# Patient Record
Sex: Female | Born: 1990 | Hispanic: Yes | Marital: Married | Smoking: Never smoker
Health system: Southern US, Community
[De-identification: ages and names within clinical notes are randomized; demographics above are authoritative.]

## PROBLEM LIST (undated history)

## (undated) DIAGNOSIS — R51 Headache: Secondary | ICD-10-CM

## (undated) DIAGNOSIS — Z8759 Personal history of other complications of pregnancy, childbirth and the puerperium: Secondary | ICD-10-CM

## (undated) DIAGNOSIS — Z9889 Other specified postprocedural states: Secondary | ICD-10-CM

## (undated) DIAGNOSIS — R112 Nausea with vomiting, unspecified: Secondary | ICD-10-CM

## (undated) DIAGNOSIS — R519 Headache, unspecified: Secondary | ICD-10-CM

## (undated) HISTORY — DX: Other specified postprocedural states: R11.2

## (undated) HISTORY — DX: Personal history of other complications of pregnancy, childbirth and the puerperium: Z87.59

## (undated) HISTORY — PX: WISDOM TOOTH EXTRACTION: SHX21

## (undated) HISTORY — DX: Headache: R51

## (undated) HISTORY — DX: Other specified postprocedural states: Z98.890

## (undated) HISTORY — DX: Headache, unspecified: R51.9

---
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
## 2016-06-23 LAB — HM PAP SMEAR

## 2016-09-01 ENCOUNTER — Ambulatory Visit (INDEPENDENT_AMBULATORY_CARE_PROVIDER_SITE_OTHER): Payer: BLUE CROSS/BLUE SHIELD | Admitting: Family Medicine

## 2016-09-01 ENCOUNTER — Encounter: Payer: Self-pay | Admitting: Family Medicine

## 2016-09-01 VITALS — Temp 98.4°F | Ht 65.0 in | Wt 128.0 lb

## 2016-09-01 DIAGNOSIS — Z8639 Personal history of other endocrine, nutritional and metabolic disease: Secondary | ICD-10-CM | POA: Diagnosis not present

## 2016-09-01 DIAGNOSIS — R42 Dizziness and giddiness: Secondary | ICD-10-CM

## 2016-09-01 DIAGNOSIS — R519 Headache, unspecified: Secondary | ICD-10-CM

## 2016-09-01 DIAGNOSIS — R51 Headache: Secondary | ICD-10-CM

## 2016-09-01 LAB — CBC WITH DIFFERENTIAL/PLATELET
BASOS ABS: 64 {cells}/uL (ref 0–200)
Basophils Relative: 1 %
EOS ABS: 64 {cells}/uL (ref 15–500)
Eosinophils Relative: 1 %
HEMATOCRIT: 34.9 % — AB (ref 35.0–45.0)
HEMOGLOBIN: 11.2 g/dL — AB (ref 11.7–15.5)
LYMPHS ABS: 1984 {cells}/uL (ref 850–3900)
LYMPHS PCT: 31 %
MCH: 25.5 pg — AB (ref 27.0–33.0)
MCHC: 32.1 g/dL (ref 32.0–36.0)
MCV: 79.5 fL — AB (ref 80.0–100.0)
MONO ABS: 448 {cells}/uL (ref 200–950)
MPV: 9 fL (ref 7.5–12.5)
Monocytes Relative: 7 %
NEUTROS PCT: 60 %
Neutro Abs: 3840 cells/uL (ref 1500–7800)
Platelets: 328 10*3/uL (ref 140–400)
RBC: 4.39 MIL/uL (ref 3.80–5.10)
RDW: 14.5 % (ref 11.0–15.0)
WBC: 6.4 10*3/uL (ref 3.8–10.8)

## 2016-09-01 LAB — FERRITIN: Ferritin: 4 ng/mL — ABNORMAL LOW (ref 10–154)

## 2016-09-01 NOTE — Progress Notes (Signed)
Chief Complaint  Patient presents with  . Establish Care    pt states having h/a's and alittle dizziness x 1 week       New Patient Visit SUBJECTIVE: HPI: Hailey Schneider is an 26 y.o.female who is being seen for establishing care.  Dizziness Light headed feeling over past week. Lasting several seconds, seeming to relieve when she is sitting and not moving. Associated b/l headache. Happens are 3x/day. She has not passed out. No injury or inciting/relieving factors that she has noticed. She will sometimes take ibuprofen, but does not like to take medication. She will intermittently get some nausea. No vomiting, fevers, injury, light/sound sensitivity, trouble swallowing, trouble with speech, weakness, numbness or tingling. She does have a lower blood pressure in addition to a hx of low iron. She is not currently taking a supplement and does not know if it has been checked recently.    No Known Allergies  Past Medical History:  Diagnosis Date  . Frequent headaches   . History of UTI    Past Surgical History:  Procedure Laterality Date  . WISDOM TOOTH EXTRACTION     Social History   Social History  . Marital status: Single   Social History Main Topics  . Smoking status: Never Smoker  . Smokeless tobacco: Never Used  . Alcohol use Yes     Comment: Social  . Drug use: No   Family History  Problem Relation Age of Onset  . Cancer Mother     Breast  . Hypertension Maternal Grandmother   . Stroke Maternal Grandfather     Takes no medications routinely.  Patient's last menstrual period was 08/27/2016 (exact date).  ROS Cardiovascular: Denies chest pain  Respiratory: Denies dyspnea   OBJECTIVE: Temp 98.4 F (36.9 C) (Oral)   Ht 5\' 5"  (1.651 m)   Wt 128 lb (58.1 kg)   LMP 08/27/2016 (Exact Date)   SpO2 95%   BMI 21.30 kg/m   Constitutional: -  VS reviewed -  Well developed, well nourished, appears stated age -  No apparent distress  Psychiatric: -  Oriented to  person, place, and time -  Memory intact -  Affect and mood normal -  Fluent conversation, good eye contact -  Judgment and insight age appropriate  Eye: -  Conjunctivae clear, no discharge -  Pupils symmetric, round, reactive to light  ENMT: -  Ears are patent b/l without erythema or discharge. TM's are shiny and clear b/l without evidence of effusion or infection. -  Oral mucosa without lesions, tongue and uvula midline    Tonsils not enlarged, no erythema, no exudate, trachea midline    Pharynx moist, no lesions, no erythema  Neck: -  No gross swelling, no palpable masses -  Thyroid midline, not enlarged, mobile, no palpable masses  Cardiovascular: -  RRR, no murmurs -  No LE edema  Respiratory: -  Normal respiratory effort, no accessory muscle use, no retraction -  Breath sounds equal, no wheezes, no ronchi, no crackles  Gastrointestinal: -  Bowel sounds normal -  No tenderness, no distention, no guarding, no masses  Neurological:  -  CN II - XII intact -  3/4 patellar reflex, 2/4 calcaneal and biceps reflex, no clonus -  No cerebellar signs -  Sensation grossly intact to light touch, equal bilaterally  Musculoskeletal: -  No clubbing, no cyanosis -  Gait normal -  5/5 strength throughout  Skin: -  No significant lesion on inspection -  Warm and dry to palpation   ASSESSMENT/PLAN: Nonintractable headache, unspecified chronicity pattern, unspecified headache type  Light headed - Plan: CBC w/Diff, Pregnancy, urine  History of iron deficiency - Plan: Ferritin, IBC panel  Orders as above. Orthostatics unremarkable. Vestibular migraine a possibility- Aleve 550 mg and repeat in 1 hr if not better. Stay well hydrated. Patient should return in 6 weeks, cancel appt if she starts doing better. Will try a prophylactic if no improvement- SNRI vs B-blocker. The patient voiced understanding and agreement to the plan.   [place], DO 09/01/16  5:30 PM

## 2016-09-01 NOTE — Patient Instructions (Addendum)
When you start to feel the light-headedness/headache come on, take 2 Aleve (550 mg total). If you are still having issues after 1 hour, repeat this (2 tabs).  Try to keep a headache diary- when do you get headaches, what are associated symptoms other than lightheadedness, what are you doing, any dietary association, etc.  Stay well hydrated.  Give [place] around 2-3 business days to get you your lab results. Call us if you have not heard anything by then.

## 2016-09-02 LAB — PREGNANCY, URINE: PREG TEST UR: NEGATIVE

## 2016-09-05 LAB — IRON: Iron: 23 ug/dL — ABNORMAL LOW (ref 40–190)

## 2016-09-05 LAB — IBC PANEL
%SAT: 7 % — ABNORMAL LOW (ref 11–50)
TIBC: 351 ug/dL (ref 250–450)
UIBC: 328 ug/dL (ref 125–400)

## 2016-10-27 ENCOUNTER — Ambulatory Visit: Payer: BLUE CROSS/BLUE SHIELD | Admitting: Family Medicine

## 2017-05-16 ENCOUNTER — Encounter: Payer: Self-pay | Admitting: Family Medicine

## 2017-05-16 ENCOUNTER — Ambulatory Visit (INDEPENDENT_AMBULATORY_CARE_PROVIDER_SITE_OTHER): Payer: BLUE CROSS/BLUE SHIELD | Admitting: Family Medicine

## 2017-05-16 VITALS — BP 102/68 | HR 61 | Temp 98.4°F | Ht 64.0 in | Wt 130.0 lb

## 2017-05-16 DIAGNOSIS — L563 Solar urticaria: Secondary | ICD-10-CM

## 2017-05-16 MED ORDER — PREDNISONE 20 MG PO TABS: 40.0000 mg | ORAL_TABLET | Freq: Every day | ORAL | 0 refills | Status: DC

## 2017-05-16 NOTE — Progress Notes (Signed)
Pre visit review using our clinic review tool, if applicable. No additional management support is needed unless otherwise documented below in the visit note. 

## 2017-05-16 NOTE — Progress Notes (Signed)
Chief Complaint  Patient presents with  . Rash    Hailey Schneider is a 26 y.o. female here for a skin complaint.  Duration: 6 days Location: arms and legs Pruritic? Yes Painful? No Drainage? No New soaps/lotions/topicals/detergents? No Sick contacts? No Other associated symptoms: none Therapies tried thus far: OTC itch cream, short amounts of relief Hx of solar urticaria, recently got back from [place].   ROS:  Const: No fevers Skin: As noted in HPI  Past Medical History:  Diagnosis Date  . Frequent headaches   . History of UTI    No Known Allergies Allergies as of 05/16/2017   No Known Allergies     Medication List       Accurate as of 05/16/17  2:04 PM. Always use your most recent med list.          predniSONE 20 MG tablet Commonly known as:  DELTASONE Take 2 tablets (40 mg total) by mouth daily with breakfast.       BP 102/68 (BP Location: Left Arm, Patient Position: Sitting, Cuff Size: Normal)   Pulse 61   Temp 98.4 F (36.9 C) (Oral)   Ht 5\' 4"  (1.626 m)   Wt 130 lb (59 kg)   SpO2 98%   BMI 22.31 kg/m  Gen: awake, alert, appearing stated age Lungs: No accessory muscle use Skin: erythematous and blanching papules on UE's and LE's b/l. No drainage, TTP, fluctuance, excoriation Psych: Age appropriate judgment and insight  Solar urticaria - Plan: predniSONE (DELTASONE) 20 MG tablet  Orders as above. PO steroid given the large surface area. Keep skin hydrated w BID emollient. F/u prn. The patient voiced understanding and agreement to the plan.  Rosa Sanchez, DO 05/16/17 2:04 PM

## 2017-05-16 NOTE — Patient Instructions (Signed)
Things to look out for: Fevers, swelling, worsening symptoms, drainage, pain. If shortness of breath go to ER or call 911.  Let us know if you need anything.

## 2018-09-05 ENCOUNTER — Encounter: Payer: Self-pay | Admitting: Family Medicine

## 2018-09-11 ENCOUNTER — Ambulatory Visit: Payer: Self-pay

## 2018-09-11 ENCOUNTER — Encounter: Payer: Self-pay | Admitting: Family Medicine

## 2018-09-11 ENCOUNTER — Ambulatory Visit (INDEPENDENT_AMBULATORY_CARE_PROVIDER_SITE_OTHER): Payer: BC Managed Care – PPO | Admitting: Family Medicine

## 2018-09-11 VITALS — BP 102/62 | HR 93 | Temp 98.1°F | Ht 65.0 in | Wt 126.4 lb

## 2018-09-11 DIAGNOSIS — Z021 Encounter for pre-employment examination: Secondary | ICD-10-CM | POA: Diagnosis not present

## 2018-09-11 DIAGNOSIS — Z0000 Encounter for general adult medical examination without abnormal findings: Secondary | ICD-10-CM

## 2018-09-11 DIAGNOSIS — Z111 Encounter for screening for respiratory tuberculosis: Secondary | ICD-10-CM

## 2018-09-11 NOTE — Progress Notes (Signed)
Chief Complaint  Patient presents with  . Annual Exam     Well Woman Hailey Schneider is here for a complete physical.   Her last physical was >1 year ago.  Current diet: in general, a "healthy" diet. Current exercise: active at work. No LMP recorded. Seatbelt? Yes  Health Maintenance Pap/HPV- Yes Tetanus- Yes HIV screening- Yes  Past Medical History:  Diagnosis Date  . Frequent headaches     Medications  Takes no meds routinely.   Allergies No Known Allergies  Review of Systems: Constitutional:  no unexpected weight changes Eye:  no recent significant change in vision Ear/Nose/Mouth/Throat:  Ears:  no tinnitus or vertigo and no recent change in hearing Nose/Mouth/Throat:  no complaints of nasal congestion, no sore throat Cardiovascular: no chest pain Respiratory:  no cough and no shortness of breath Gastrointestinal:  no abdominal pain, no change in bowel habits GU:  Female: negative for dysuria or pelvic pain Musculoskeletal/Extremities:  no pain of the joints Integumentary (Skin/Breast):  no abnormal skin lesions reported Neurologic:  no headaches Endocrine:  denies fatigue Hematologic/Lymphatic:  No areas of easy bleeding  Exam BP 102/62 (BP Location: Left Arm, Patient Position: Sitting, Cuff Size: Normal)   Pulse 93   Temp 98.1 F (36.7 C) (Oral)   Ht 5\' 5"  (1.651 m)   Wt 126 lb 6 oz (57.3 kg)   SpO2 98%   BMI 21.03 kg/m  General:  well developed, well nourished, in no apparent distress Skin:  no significant moles, warts, or growths Head:  no masses, lesions, or tenderness Eyes:  pupils equal and round, sclera anicteric without injection Ears:  canals without lesions, TMs shiny without retraction, no obvious effusion, no erythema Nose:  nares patent, septum midline, mucosa normal, and no drainage or sinus tenderness Throat/Pharynx:  lips and gingiva without lesion; tongue and uvula midline; non-inflamed pharynx; no exudates or postnasal drainage Neck:  neck supple without adenopathy, thyromegaly, or masses Lungs:  clear to auscultation, breath sounds equal bilaterally, no respiratory distress Cardio:  regular rate and rhythm, no bruits, no LE edema Abdomen:  abdomen soft, nontender; bowel sounds normal; no masses or organomegaly Genital: Defer to GYN Musculoskeletal:  symmetrical muscle groups noted without atrophy or deformity Extremities:  no clubbing, cyanosis, or edema, no deformities, no skin discoloration Neuro:  gait normal; deep tendon reflexes normal and symmetric Psych: well oriented with normal range of affect and appropriate judgment/insight  Assessment and Plan  Well adult exam - Plan: CBC, Comprehensive metabolic panel, Lipid panel  Screening-pulmonary TB - Plan: QuantiFERON-TB Gold Plus  Need for history and physical examination for employment - Plan: QuantiFERON-TB Gold Plus    Well 28 y.o. female. Counseled on diet and exercise. Sleep hygiene info given. We have form for work that we will complete when we get records back. She will plan to pick up. Other orders as above. Follow up in 1 yr or prn. The patient voiced understanding and agreement to the plan.  [place], DO 09/11/18 3:20 PM

## 2018-09-11 NOTE — Patient Instructions (Addendum)
Sleep Hygiene Tips:  Do not watch TV or look at screens within 1 hour of going to bed. If you do, make sure there is a blue light filter (nighttime mode) involved.  Try to go to bed around the same time every night. Wake up at the same time within 1 hour of regular time. Ex: If you wake up at 7 AM for work, do not sleep past 8 AM on days that you don't work.  Do not drink alcohol before bedtime.  Do not consume caffeine-containing beverages after noon or within 9 hours of intended bedtime.  Get regular exercise/physical activity in your life, but not within 2 hours of planned bedtime.  Do not take naps.   Do not eat within 2 hours of planned bedtime.  Melatonin, 3-5 mg 30-60 minutes before planned bedtime may be helpful.   The bed should be for sleep or sex only. If after 20-30 minutes you are unable to fall asleep, get up and do something relaxing. Do this until you feel ready to go to sleep again.   Sleep is important to [place] all. Getting good sleep is imperative to adequate functioning during the day. Work with our counselors who are trained to help people obtain quality sleep. Call [phone] to schedule an appointment or if you are curious about insurance coverage/cost.  Give [place] 2-3 business days to get the results of your labs back.   Keep the diet clean and stay active.  Aim to do some physical exertion for 150 minutes per week. This is typically divided into 5 days per week, 30 minutes per day. The activity should be enough to get your heart rate up. Anything is better than nothing if you have time constraints.  OK to use Debrox (peroxide) in the ear to loosen up wax. Also recommend using a bulb syringe (for removing boogers from baby's noses) to flush through warm water. Do not use Q-tips as this can impact wax further.  Let us know if you need anything.

## 2018-09-12 LAB — COMPREHENSIVE METABOLIC PANEL
ALBUMIN: 4.5 g/dL (ref 3.5–5.2)
ALT: 9 U/L (ref 0–35)
AST: 13 U/L (ref 0–37)
Alkaline Phosphatase: 41 U/L (ref 39–117)
BUN: 10 mg/dL (ref 6–23)
CALCIUM: 9.4 mg/dL (ref 8.4–10.5)
CHLORIDE: 104 meq/L (ref 96–112)
CO2: 27 meq/L (ref 19–32)
Creatinine, Ser: 0.73 mg/dL (ref 0.40–1.20)
GFR: 96.83 mL/min (ref >60.00)
GLUCOSE: 74 mg/dL (ref 70–99)
Potassium: 4 mEq/L (ref 3.5–5.1)
Sodium: 138 mEq/L (ref 135–145)
Total Bilirubin: 0.5 mg/dL (ref 0.2–1.2)
Total Protein: 7 g/dL (ref 6.0–8.3)

## 2018-09-12 LAB — LIPID PANEL
CHOL/HDL RATIO: 2
CHOLESTEROL: 165 mg/dL (ref 0–200)
HDL: 73.9 mg/dL (ref >39.00)
LDL Cholesterol: 84 mg/dL (ref 0–99)
NonHDL: 91.33
Triglycerides: 39 mg/dL (ref 0.0–149.0)
VLDL: 7.8 mg/dL (ref 0.0–40.0)

## 2018-09-12 LAB — CBC
HCT: 38 % (ref 36.0–46.0)
Hemoglobin: 12.7 g/dL (ref 12.0–15.0)
MCHC: 33.5 g/dL (ref 30.0–36.0)
MCV: 85.7 fl (ref 78.0–100.0)
PLATELETS: 217 10*3/uL (ref 150.0–400.0)
RBC: 4.44 Mil/uL (ref 3.87–5.11)
RDW: 13.3 % (ref 11.5–15.5)
WBC: 7.2 10*3/uL (ref 4.0–10.5)

## 2018-09-13 LAB — QUANTIFERON-TB GOLD PLUS
Mitogen-NIL: >10 IU/mL
NIL: 0.02 IU/mL
QuantiFERON-TB Gold Plus: NEGATIVE
TB1-NIL: 0 [IU]/mL
TB2-NIL: 0 IU/mL

## 2019-03-20 ENCOUNTER — Telehealth: Payer: Self-pay | Admitting: Family Medicine

## 2019-03-20 NOTE — Telephone Encounter (Signed)
Patient is requesting a Transfer of Care to Dr. Loletha Grayer at [place]. The patient has moved and the [place] office is closer to her home.   Please advise [phone]

## 2019-03-21 NOTE — Telephone Encounter (Signed)
I called and spoke to patient that TOC was approved to see. Dr. Bryan Lemma. Patient will call back at a later date to schedule.

## 2019-03-21 NOTE — Telephone Encounter (Signed)
OK w me.  

## 2019-03-21 NOTE — Telephone Encounter (Signed)
Fine with me! Thank you!

## 2019-03-21 NOTE — Telephone Encounter (Signed)
Admin team can you help me set this up. Thanks!

## 2019-03-25 ENCOUNTER — Other Ambulatory Visit: Payer: Self-pay

## 2019-03-25 ENCOUNTER — Ambulatory Visit (INDEPENDENT_AMBULATORY_CARE_PROVIDER_SITE_OTHER): Payer: BC Managed Care – PPO

## 2019-03-25 DIAGNOSIS — Z23 Encounter for immunization: Secondary | ICD-10-CM | POA: Diagnosis not present

## 2019-03-25 NOTE — Progress Notes (Signed)
After obtaining consent, and per orders of Dr. Nani Ravens, injection of Flu given in left deltoid by Donis Pinder Berneta Sages. Patient instructed to remain in clinic for 20 minutes afterwards, and to report any adverse reaction to me immediately/thx dmf

## 2019-04-15 LAB — OB RESULTS CONSOLE ABO/RH: RH Type: POSITIVE

## 2019-04-15 LAB — OB RESULTS CONSOLE HEPATITIS B SURFACE ANTIGEN: Hepatitis B Surface Ag: NEGATIVE

## 2019-04-15 LAB — OB RESULTS CONSOLE GC/CHLAMYDIA
Chlamydia: NEGATIVE
Gonorrhea: NEGATIVE

## 2019-04-15 LAB — OB RESULTS CONSOLE ANTIBODY SCREEN: Antibody Screen: NEGATIVE

## 2019-04-15 LAB — OB RESULTS CONSOLE HIV ANTIBODY (ROUTINE TESTING): HIV: NONREACTIVE

## 2019-04-15 LAB — OB RESULTS CONSOLE RUBELLA ANTIBODY, IGM: Rubella: IMMUNE

## 2019-04-15 LAB — OB RESULTS CONSOLE RPR: RPR: NONREACTIVE

## 2019-05-31 ENCOUNTER — Encounter (HOSPITAL_COMMUNITY): Payer: Self-pay

## 2019-05-31 ENCOUNTER — Inpatient Hospital Stay (HOSPITAL_COMMUNITY)
Admission: EM | Admit: 2019-05-31 | Discharge: 2019-05-31 | Disposition: A | Discharge status: Home / Self Care | Payer: BC Managed Care – PPO | Attending: Obstetrics and Gynecology | Admitting: Obstetrics and Gynecology

## 2019-05-31 ENCOUNTER — Other Ambulatory Visit: Payer: Self-pay

## 2019-05-31 ENCOUNTER — Inpatient Hospital Stay (HOSPITAL_BASED_OUTPATIENT_CLINIC_OR_DEPARTMENT_OTHER): Payer: BC Managed Care – PPO

## 2019-05-31 DIAGNOSIS — Z3A15 15 weeks gestation of pregnancy: Secondary | ICD-10-CM | POA: Insufficient documentation

## 2019-05-31 DIAGNOSIS — O4692 Antepartum hemorrhage, unspecified, second trimester: Secondary | ICD-10-CM

## 2019-05-31 DIAGNOSIS — O26852 Spotting complicating pregnancy, second trimester: Secondary | ICD-10-CM | POA: Insufficient documentation

## 2019-05-31 DIAGNOSIS — O26892 Other specified pregnancy related conditions, second trimester: Secondary | ICD-10-CM

## 2019-05-31 DIAGNOSIS — O209 Hemorrhage in early pregnancy, unspecified: Secondary | ICD-10-CM | POA: Diagnosis present

## 2019-05-31 DIAGNOSIS — N939 Abnormal uterine and vaginal bleeding, unspecified: Secondary | ICD-10-CM

## 2019-05-31 LAB — URINALYSIS, ROUTINE W REFLEX MICROSCOPIC
Bilirubin Urine: NEGATIVE
Glucose, UA: NEGATIVE mg/dL
Hgb urine dipstick: NEGATIVE
Ketones, ur: NEGATIVE mg/dL
Nitrite: NEGATIVE
Protein, ur: NEGATIVE mg/dL
Specific Gravity, Urine: 1.012 (ref 1.005–1.030)
pH: 8 (ref 5.0–8.0)

## 2019-05-31 LAB — WET PREP, GENITAL
Clue Cells Wet Prep HPF POC: NONE SEEN
Sperm: NONE SEEN
Trich, Wet Prep: NONE SEEN
Yeast Wet Prep HPF POC: NONE SEEN

## 2019-05-31 NOTE — MAU Note (Signed)
Hailey Schneider is a 28 y.o. at [redacted]w[redacted]d here in MAU reporting: yesterday she was having some brown discharge and it turned pink towards the end of the day. Having some light pink bleeding still today and intermittent cramping. States today she is seeing the bleeding when she wipes in the bathroom and then some goes into the toilet, yesterday was seeing it in her panties. No recent IC.  Onset of complaint: yesterday  Pain score: 2/10  Vitals:   05/31/19 1208  BP: 122/80  Pulse: 99  Resp: 16  Temp: 98.5 F (36.9 C)  SpO2: 99%     Lab orders placed from triage: UA

## 2019-05-31 NOTE — MAU Provider Note (Signed)
History     CSN: [id number]  Arrival date and time: 05/31/19 1151   First Provider Initiated Contact with Patient 05/31/19 1238      Chief Complaint  Patient presents with  . Abdominal Pain  . Vaginal Bleeding   Hailey Schneider is a 28 y.o. G1P0 at [redacted]w[redacted]d who presents to MAU with complaints of abdominal pain and vaginal bleeding. She reports vaginal bleeding started occurring yesterday. Reports that it started out as brown discharge yesterday that turned into light pink spotting toward the end of the day and continued today. She reports that she sees the spotting when she wipes- denies having to wear a panty liner or pad. Saw it in her underwear a couple of times. Denies recent IC. She reports vaginal bleeding is associated with abdominal pain. Describes as intermittent cramping and pressure. Rates pain 1-2/10, has not taken any medication for abdominal pain. Patient denies complications during this pregnancy.   Patient blood type is O Positive based on prenatal records.    OB History    Gravida  1   Para      Term      Preterm      AB      Living        SAB      TAB      Ectopic      Multiple      Live Births              Past Medical History:  Diagnosis Date  . Frequent headaches     Past Surgical History:  Procedure Laterality Date  . WISDOM TOOTH EXTRACTION      Family History  Problem Relation Age of Onset  . Cancer Mother        Breast  . Hypertension Maternal Grandmother   . Stroke Maternal Grandfather     Social History   Tobacco Use  . Smoking status: Never Smoker  . Smokeless tobacco: Never Used  Substance Use Topics  . Alcohol use: Yes    Comment: Social  . Drug use: No    Allergies: No Known Allergies  Medications Prior to Admission  Medication Sig Dispense Refill Last Dose  . Prenatal Vit-Fe Fumarate-FA (PRENATAL MULTIVITAMIN) TABS tablet Take 1 tablet by mouth daily at 12 noon.     . pyridOXINE (B-6) 50 MG tablet Take 50  mg by mouth daily.       Review of Systems  Constitutional: Negative.   Respiratory: Negative.   Cardiovascular: Negative.   Gastrointestinal: Positive for abdominal pain. Negative for constipation, diarrhea, nausea and vomiting.  Genitourinary: Positive for vaginal bleeding. Negative for difficulty urinating, dysuria, frequency, hematuria, pelvic pain, urgency and vaginal pain.  Musculoskeletal: Negative.   Neurological: Negative.    Physical Exam   Blood pressure 122/80, pulse 99, temperature 98.5 F (36.9 C), temperature source Oral, resp. rate 16, height 5\' 4"  (1.626 m), weight 57.7 kg, SpO2 99 %.  Physical Exam  Nursing note and vitals reviewed. Constitutional: She is oriented to person, place, and time. She appears well-developed and well-nourished. No distress.  Cardiovascular: Normal rate and regular rhythm.  Respiratory: Effort normal and breath sounds normal. No respiratory distress. She has no wheezes.  GI: Soft. There is no abdominal tenderness. There is no rebound and no guarding.  Genitourinary:    Vaginal bleeding present.  There is bleeding in the vagina.    Genitourinary Comments: Pelvic exam: Cervix pink, visually closed, without lesion, small amount of  light pink bleeding present, vaginal walls and external genitalia normal   Musculoskeletal: Normal range of motion.        General: No edema.  Neurological: She is alert and oriented to person, place, and time.  Psychiatric: She has a normal mood and affect. Her behavior is normal. Thought content normal.   MAU Course  Procedures  MDM Orders Placed This Encounter  Procedures  . Wet prep, genital  . Culture, OB Urine  . [place] MFM OB LIMITED  . Urinalysis, Routine w reflex microscopic   Patient is O + - no Rhogam indicated  Patient does not know if placenta is posterior - she denies any complications during pregnancy, cervical examination deferred until placental location identified.   Labs and [place] report  reviewed:  Results for orders placed or performed during the hospital encounter of 05/31/19 (from the past 24 hour(s))  Urinalysis, Routine w reflex microscopic     Status: Abnormal   Collection Time: 05/31/19 12:25 PM  Result Value Ref Range   Color, Urine YELLOW YELLOW   APPearance CLEAR CLEAR   Specific Gravity, Urine 1.012 1.005 - 1.030   pH 8.0 5.0 - 8.0   Glucose, UA NEGATIVE NEGATIVE mg/dL   Hgb urine dipstick NEGATIVE NEGATIVE   Bilirubin Urine NEGATIVE NEGATIVE   Ketones, ur NEGATIVE NEGATIVE mg/dL   Protein, ur NEGATIVE NEGATIVE mg/dL   Nitrite NEGATIVE NEGATIVE   Leukocytes,Ua TRACE (A) NEGATIVE   WBC, UA 0-5 0 - 5 WBC/hpf   Bacteria, UA RARE (A) NONE SEEN   Squamous Epithelial / LPF 0-5 0 - 5   Mucus PRESENT   Wet prep, genital     Status: Abnormal   Collection Time: 05/31/19 12:45 PM   Specimen: Cervix  Result Value Ref Range   Yeast Wet Prep HPF POC NONE SEEN NONE SEEN   Trich, Wet Prep NONE SEEN NONE SEEN   Clue Cells Wet Prep HPF POC NONE SEEN NONE SEEN   WBC, Wet Prep HPF POC MANY (A) NONE SEEN   Sperm NONE SEEN    US showed anterior placenta - no previa or abruption identified. FHR 161 on [place]. AFV WNL and CL 4.54cm.   Cervix checked - closed/thick/posterior Urine culture pending    Educated and discussed vaginal spotting may be coming from resolved [facility] that was not seen on [place] with the presence of normal [place] and labs. Encouraged to perform pelvic rest and abstain from intercourse for 7 days after the bleeding stops. Discussed warning signs with patient and to return to MAU if bleeding worsens and abdominal pain increases- patient verbalizes understanding. Encouraged not to perform extraneous activity or lifting.   Discussed reasons to return to MAU. Follow up as scheduled in the office. Return to MAU as needed. Pt stable at time of discharge.   Assessment and Plan   1. Vaginal spotting   2. [redacted] weeks gestation of pregnancy    Discharge home Follow up as  scheduled in the office for prenatal care Return to MAU as needed for reasons discussed and/or emergencies  Pelvic rest and hydration  Urine culture pending - will manage accordingly   Follow-up Information    [place] Follow up.   Why: Follow up as scheduled for prenatal appointment  Contact information: [place] [id number] [phone]            [place]  05/31/2019, 2:41 PM

## 2019-06-01 LAB — CULTURE, OB URINE: Culture: NO GROWTH

## 2019-06-02 LAB — GC/CHLAMYDIA PROBE AMP (~~LOC~~) NOT AT ARMC
Chlamydia: NEGATIVE
Comment: NEGATIVE
Comment: NORMAL
Neisseria Gonorrhea: NEGATIVE

## 2019-07-25 NOTE — L&D Delivery Note (Signed)
     Hailey Schneider, Hailey Schneider [id number]  Delivery Note At 4:09 PM a viable female was delivered via Vaginal, Spontaneous (Presentation: Right Occiput Anterior).  APGAR: 9, 9; weight  .   Placenta status: Spontaneous, Intact.  Cord: 3 vessels with the following complications: None.  Cord pH: not sent  Anesthesia: Epidural Episiotomy: None Lacerations: 2nd degree Suture Repair: 2.0 3.0 vicryl Est. Blood Loss (mL):  900cc - mostly s/s deep 2nd degree laceration repaired without complications. No uterine atony.    It's a girl - "Chrys Racer"!!   Mom to postpartum.  Baby to Couplet care / Skin to Skin.  Tyson Dense 11/17/2019, 5:05 PM

## 2019-10-22 LAB — OB RESULTS CONSOLE GBS: GBS: NEGATIVE

## 2019-10-24 ENCOUNTER — Encounter (HOSPITAL_COMMUNITY): Payer: Self-pay | Admitting: *Deleted

## 2019-10-24 ENCOUNTER — Telehealth (HOSPITAL_COMMUNITY): Payer: Self-pay | Admitting: *Deleted

## 2019-10-24 NOTE — Telephone Encounter (Signed)
Preadmission screen  

## 2019-10-27 ENCOUNTER — Other Ambulatory Visit (HOSPITAL_COMMUNITY)
Admission: RE | Admit: 2019-10-27 | Discharge: 2019-10-27 | Disposition: A | Discharge status: Home / Self Care | Payer: BC Managed Care – PPO | Source: Ambulatory Visit | Attending: Obstetrics & Gynecology | Admitting: Obstetrics & Gynecology

## 2019-10-27 ENCOUNTER — Encounter (HOSPITAL_COMMUNITY): Payer: Self-pay | Admitting: *Deleted

## 2019-10-27 DIAGNOSIS — Z01812 Encounter for preprocedural laboratory examination: Secondary | ICD-10-CM | POA: Insufficient documentation

## 2019-10-27 DIAGNOSIS — Z20822 Contact with and (suspected) exposure to covid-19: Secondary | ICD-10-CM | POA: Diagnosis not present

## 2019-10-27 LAB — SARS CORONAVIRUS 2 (TAT 6-24 HRS): SARS Coronavirus 2: NEGATIVE

## 2019-10-28 ENCOUNTER — Other Ambulatory Visit (HOSPITAL_COMMUNITY): Payer: BC Managed Care – PPO

## 2019-10-28 NOTE — H&P (Signed)
Hailey Schneider is a 29 y.o. female G1 at 56 weeks presenting for ECV; breech presentation.  Antepartum course has been uncomplicated.  Patient was offered primary C/S vs ECV with all risks discussed and elects to proceed with ECV.  Blood type O pos.  OB History    Gravida  1   Para      Term      Preterm      AB      Living        SAB      TAB      Ectopic      Multiple      Live Births             Past Medical History:  Diagnosis Date  . Frequent headaches   . PONV (postoperative nausea and vomiting)    Past Surgical History:  Procedure Laterality Date  . WISDOM TOOTH EXTRACTION     Family History: family history includes Cancer in her mother; Hypertension in her maternal grandmother; Stroke in her maternal grandfather. Social History:  reports that she has never smoked. She has never used smokeless tobacco. She reports current alcohol use. She reports that she does not use drugs.     Maternal Diabetes: No Genetic Screening: Normal Maternal Ultrasounds/Referrals: Normal Fetal Ultrasounds or other Referrals:  None Maternal Substance Abuse:  No Significant Maternal Medications:  None Significant Maternal Lab Results:  Group B Strep negative Other Comments:  None  Review of Systems History   There were no vitals taken for this visit. Exam Physical Exam  Prenatal labs: ABO, Rh: O/Positive/-- (09/22 0000) Antibody: Negative (09/22 0000) Rubella: Immune (09/22 0000) RPR: Nonreactive (09/22 0000)  HBsAg: Negative (09/22 0000)  HIV: Non-reactive (09/22 0000)  GBS:     Assessment/Plan: 28yo G1 at 36 weeks for ECV Patient has been counseled re: options for primary cesarean vs ECV.  She is informed of risk of fetal distress, failure and if turned, baby turning back to breech.  She understands in the case of fetal distress, we may need to proceed for stat C/S.  All questions were answered and the patient wishes to proceed.   Linda Hedges 10/28/2019, 8:54  AM

## 2019-10-30 ENCOUNTER — Encounter (HOSPITAL_COMMUNITY): Payer: Self-pay | Admitting: Obstetrics & Gynecology

## 2019-10-30 ENCOUNTER — Observation Stay (HOSPITAL_COMMUNITY)
Admission: AD | Admit: 2019-10-30 | Discharge: 2019-10-30 | Disposition: A | Discharge status: Home / Self Care | Payer: BC Managed Care – PPO | Attending: Obstetrics & Gynecology | Admitting: Obstetrics & Gynecology

## 2019-10-30 ENCOUNTER — Inpatient Hospital Stay (HOSPITAL_COMMUNITY): Payer: BC Managed Care – PPO

## 2019-10-30 ENCOUNTER — Other Ambulatory Visit: Payer: Self-pay

## 2019-10-30 DIAGNOSIS — O321XX0 Maternal care for breech presentation, not applicable or unspecified: Principal | ICD-10-CM | POA: Insufficient documentation

## 2019-10-30 DIAGNOSIS — Z3A36 36 weeks gestation of pregnancy: Secondary | ICD-10-CM | POA: Diagnosis not present

## 2019-10-30 LAB — CBC
HCT: 36.9 % (ref 36.0–46.0)
Hemoglobin: 11.6 g/dL — ABNORMAL LOW (ref 12.0–15.0)
MCH: 29.1 pg (ref 26.0–34.0)
MCHC: 31.4 g/dL (ref 30.0–36.0)
MCV: 92.5 fL (ref 80.0–100.0)
Platelets: 262 10*3/uL (ref 150–400)
RBC: 3.99 MIL/uL (ref 3.87–5.11)
RDW: 15.6 % — ABNORMAL HIGH (ref 11.5–15.5)
WBC: 9 10*3/uL (ref 4.0–10.5)
nRBC: 0 % (ref 0.0–0.2)

## 2019-10-30 LAB — TYPE AND SCREEN
ABO/RH(D): O POS
Antibody Screen: NEGATIVE

## 2019-10-30 LAB — ABO/RH: ABO/RH(D): O POS

## 2019-10-30 LAB — RPR: RPR Ser Ql: NONREACTIVE

## 2019-10-30 MED ORDER — TERBUTALINE SULFATE 1 MG/ML IJ SOLN
0.2500 mg | Freq: Once | INTRAMUSCULAR | Status: AC
  Administered 2019-10-30: 0.25 mg via SUBCUTANEOUS

## 2019-10-30 MED ORDER — LACTATED RINGERS IV SOLN: INTRAVENOUS | Status: DC

## 2019-10-30 MED ORDER — TERBUTALINE SULFATE 1 MG/ML IJ SOLN
INTRAMUSCULAR | Status: AC
  Filled 2019-10-30: qty 1

## 2019-10-30 MED ORDER — ONDANSETRON HCL 4 MG/2ML IJ SOLN: 4.0000 mg | Freq: Four times a day (QID) | INTRAMUSCULAR | Status: DC | PRN

## 2019-10-30 MED ORDER — SOD CITRATE-CITRIC ACID 500-334 MG/5ML PO SOLN: 30.0000 mL | ORAL | Status: DC | PRN

## 2019-10-30 NOTE — Progress Notes (Signed)
FHR reactive and reassuring, second reviewer Royetta Crochet - pt discharged with instructions - ambulatory with support person -  [email or web address]

## 2019-10-30 NOTE — Op Note (Signed)
External Cephalic Version  Pt was confirmed breech by ultrasound today (head in RUQ).  She desires attempt at ECV.  Risks and benefits have been discussed at length and informed consent was obtained. EFM with reactive tracing and patient was given terbutaline .25mg  SQ.  IV had been placed and Ultrasound at bedside.  Fetal vertex in RUQ was confirmed and fetal buttocks was elevated out of the pelvis.  In a counterclockwise fashion, ECV was carried out with successful placement of fetal vertex in the cephalic position confirmed by ultrasound.  Pt and fetus tolerated the procedure well.  EFM was reassuring immediately after the procedure and the patient was excited and reassured by the procedure. Plan to monitor EFM for 1 hour and if remains reassuring will discharge the patient home with routine labor warnings and kickcounts.  Abdominal binder ordered to be placed.  Follow up in the office as scheduled next week.

## 2019-11-12 ENCOUNTER — Telehealth (HOSPITAL_COMMUNITY): Payer: Self-pay | Admitting: *Deleted

## 2019-11-12 NOTE — Telephone Encounter (Signed)
Preadmission screen  

## 2019-11-14 NOTE — H&P (Signed)
Hailey Schneider is a 29 y.o. female presenting for IOL. Had successful ECV 2 weeks ago and has been confirmed vertex in the office twice since then. OB History    Gravida  1   Para      Term      Preterm      AB      Living        SAB      TAB      Ectopic      Multiple      Live Births             Past Medical History:  Diagnosis Date  . Frequent headaches   . PONV (postoperative nausea and vomiting)    Past Surgical History:  Procedure Laterality Date  . WISDOM TOOTH EXTRACTION     Family History: family history includes Cancer in her mother; Hypertension in her maternal grandmother; Stroke in her maternal grandfather. Social History:  reports that she has never smoked. She has never used smokeless tobacco. She reports current alcohol use. She reports that she does not use drugs.     Maternal Diabetes: No Genetic Screening: Normal Maternal Ultrasounds/Referrals: Normal Fetal Ultrasounds or other Referrals:  None Maternal Substance Abuse:  No Significant Maternal Medications:  None Significant Maternal Lab Results:  None Other Comments:  None  Review of Systems History   There were no vitals taken for this visit. Exam Physical Exam  (from office) NAD, A&O NWOB Abd soft, nondistended, gravid  Prenatal labs: ABO, Rh: --/--/O POS, O POS Performed at [facility], [place] [address] [place], [place] [address]  [phone] Antibody: NEG (04/08 0805) Rubella: Immune (09/22 0000) RPR: NON REACTIVE (04/08 0800)  HBsAg: Negative (09/22 0000)  HIV: Non-reactive (09/22 0000)  GBS:     Assessment/Plan: 29 yo G1P0 @ 39.3 presenting for IOL s/s elective and previously unstable lie confirmed to be vertex x2 in office since ECV. Cervix unfavorable. Plan for cytotec followed by pitocin/AROM when more favorable.  GBS negative.     Hailey Schneider 11/14/2019, 11:07 AM

## 2019-11-14 NOTE — H&P (Deleted)
  The note originally documented on this encounter has been moved the the encounter in which it belongs.  

## 2019-11-15 ENCOUNTER — Other Ambulatory Visit (HOSPITAL_COMMUNITY)
Admission: RE | Admit: 2019-11-15 | Discharge: 2019-11-15 | Disposition: A | Discharge status: Home / Self Care | Payer: BC Managed Care – PPO | Source: Ambulatory Visit | Attending: Obstetrics & Gynecology | Admitting: Obstetrics & Gynecology

## 2019-11-15 DIAGNOSIS — Z20822 Contact with and (suspected) exposure to covid-19: Secondary | ICD-10-CM | POA: Insufficient documentation

## 2019-11-15 DIAGNOSIS — Z01812 Encounter for preprocedural laboratory examination: Secondary | ICD-10-CM | POA: Insufficient documentation

## 2019-11-15 LAB — SARS CORONAVIRUS 2 (TAT 6-24 HRS): SARS Coronavirus 2: NEGATIVE

## 2019-11-17 ENCOUNTER — Inpatient Hospital Stay (HOSPITAL_COMMUNITY): Payer: BC Managed Care – PPO | Admitting: Anesthesiology

## 2019-11-17 ENCOUNTER — Inpatient Hospital Stay (HOSPITAL_COMMUNITY)
Admission: AD | Admit: 2019-11-17 | Payer: BC Managed Care – PPO | Source: Home / Self Care | Admitting: Obstetrics & Gynecology

## 2019-11-17 ENCOUNTER — Other Ambulatory Visit: Payer: Self-pay

## 2019-11-17 ENCOUNTER — Inpatient Hospital Stay (HOSPITAL_COMMUNITY): Payer: BC Managed Care – PPO

## 2019-11-17 ENCOUNTER — Inpatient Hospital Stay (HOSPITAL_COMMUNITY)
Admission: RE | Admit: 2019-11-17 | Discharge: 2019-11-19 | DRG: 807 | Disposition: A | Discharge status: Home / Self Care | Payer: BC Managed Care – PPO | Attending: Obstetrics and Gynecology | Admitting: Obstetrics and Gynecology

## 2019-11-17 ENCOUNTER — Encounter (HOSPITAL_COMMUNITY): Payer: Self-pay | Admitting: Obstetrics and Gynecology

## 2019-11-17 DIAGNOSIS — Z20822 Contact with and (suspected) exposure to covid-19: Secondary | ICD-10-CM | POA: Diagnosis present

## 2019-11-17 DIAGNOSIS — O9081 Anemia of the puerperium: Secondary | ICD-10-CM | POA: Diagnosis not present

## 2019-11-17 DIAGNOSIS — Z3A39 39 weeks gestation of pregnancy: Secondary | ICD-10-CM

## 2019-11-17 DIAGNOSIS — O26893 Other specified pregnancy related conditions, third trimester: Secondary | ICD-10-CM | POA: Diagnosis present

## 2019-11-17 DIAGNOSIS — Z3490 Encounter for supervision of normal pregnancy, unspecified, unspecified trimester: Secondary | ICD-10-CM

## 2019-11-17 LAB — CBC
HCT: 37.4 % (ref 36.0–46.0)
Hemoglobin: 12.2 g/dL (ref 12.0–15.0)
MCH: 28.6 pg (ref 26.0–34.0)
MCHC: 32.6 g/dL (ref 30.0–36.0)
MCV: 87.6 fL (ref 80.0–100.0)
Platelets: 291 10*3/uL (ref 150–400)
RBC: 4.27 MIL/uL (ref 3.87–5.11)
RDW: 15.1 % (ref 11.5–15.5)
WBC: 9.7 10*3/uL (ref 4.0–10.5)
nRBC: 0 % (ref 0.0–0.2)

## 2019-11-17 LAB — TYPE AND SCREEN
ABO/RH(D): O POS
Antibody Screen: NEGATIVE

## 2019-11-17 LAB — RPR: RPR Ser Ql: NONREACTIVE

## 2019-11-17 MED ORDER — FENTANYL-BUPIVACAINE-NACL 0.5-0.125-0.9 MG/250ML-% EP SOLN
EPIDURAL | Status: AC
  Filled 2019-11-17: qty 250

## 2019-11-17 MED ORDER — COCONUT OIL OIL: 1.0000 "application " | TOPICAL_OIL | Status: DC | PRN

## 2019-11-17 MED ORDER — SOD CITRATE-CITRIC ACID 500-334 MG/5ML PO SOLN: 30.0000 mL | ORAL | Status: DC | PRN

## 2019-11-17 MED ORDER — ONDANSETRON HCL 4 MG/2ML IJ SOLN
4.0000 mg | Freq: Four times a day (QID) | INTRAMUSCULAR | Status: DC | PRN
  Administered 2019-11-17: 14:00:00 4 mg via INTRAVENOUS
  Filled 2019-11-17: qty 2

## 2019-11-17 MED ORDER — OXYCODONE HCL 5 MG PO TABS: 10.0000 mg | ORAL_TABLET | ORAL | Status: DC | PRN

## 2019-11-17 MED ORDER — DIPHENHYDRAMINE HCL 25 MG PO CAPS: 25.0000 mg | ORAL_CAPSULE | Freq: Four times a day (QID) | ORAL | Status: DC | PRN

## 2019-11-17 MED ORDER — ONDANSETRON HCL 4 MG PO TABS: 4.0000 mg | ORAL_TABLET | ORAL | Status: DC | PRN

## 2019-11-17 MED ORDER — OXYTOCIN 40 UNITS IN NORMAL SALINE INFUSION - SIMPLE MED
INTRAVENOUS | Status: AC
  Filled 2019-11-17: qty 1000

## 2019-11-17 MED ORDER — ACETAMINOPHEN 325 MG PO TABS
650.0000 mg | ORAL_TABLET | ORAL | Status: DC | PRN
  Administered 2019-11-17: 650 mg via ORAL
  Filled 2019-11-17 (×2): qty 2

## 2019-11-17 MED ORDER — TETANUS-DIPHTH-ACELL PERTUSSIS 5-2.5-18.5 LF-MCG/0.5 IM SUSP: 0.5000 mL | Freq: Once | INTRAMUSCULAR | Status: DC

## 2019-11-17 MED ORDER — LACTATED RINGERS IV SOLN: INTRAVENOUS | Status: DC

## 2019-11-17 MED ORDER — OXYCODONE HCL 5 MG PO TABS: 5.0000 mg | ORAL_TABLET | ORAL | Status: DC | PRN

## 2019-11-17 MED ORDER — BENZOCAINE-MENTHOL 20-0.5 % EX AERO
1.0000 "application " | INHALATION_SPRAY | CUTANEOUS | Status: DC | PRN
  Administered 2019-11-18 – 2019-11-19 (×2): 1 via TOPICAL
  Filled 2019-11-17 (×2): qty 56

## 2019-11-17 MED ORDER — LACTATED RINGERS IV SOLN
500.0000 mL | INTRAVENOUS | Status: DC | PRN
  Administered 2019-11-17: 14:00:00 500 mL via INTRAVENOUS

## 2019-11-17 MED ORDER — EPHEDRINE 5 MG/ML INJ: 10.0000 mg | INTRAVENOUS | Status: DC | PRN

## 2019-11-17 MED ORDER — HYDROXYZINE HCL 50 MG PO TABS: 50.0000 mg | ORAL_TABLET | Freq: Four times a day (QID) | ORAL | Status: DC | PRN

## 2019-11-17 MED ORDER — LIDOCAINE HCL (PF) 1 % IJ SOLN: 30.0000 mL | INTRAMUSCULAR | Status: DC | PRN

## 2019-11-17 MED ORDER — BUTORPHANOL TARTRATE 1 MG/ML IJ SOLN: 1.0000 mg | INTRAMUSCULAR | Status: DC | PRN

## 2019-11-17 MED ORDER — PRENATAL MULTIVITAMIN CH
1.0000 | ORAL_TABLET | Freq: Every day | ORAL | Status: DC
  Administered 2019-11-18: 1 via ORAL
  Filled 2019-11-17: qty 1

## 2019-11-17 MED ORDER — ZOLPIDEM TARTRATE 5 MG PO TABS: 5.0000 mg | ORAL_TABLET | Freq: Every evening | ORAL | Status: DC | PRN

## 2019-11-17 MED ORDER — LACTATED RINGERS IV SOLN
500.0000 mL | Freq: Once | INTRAVENOUS | Status: AC
  Administered 2019-11-17: 10:00:00 500 mL via INTRAVENOUS

## 2019-11-17 MED ORDER — PHENYLEPHRINE 40 MCG/ML (10ML) SYRINGE FOR IV PUSH (FOR BLOOD PRESSURE SUPPORT)
80.0000 ug | PREFILLED_SYRINGE | INTRAVENOUS | Status: DC | PRN
  Administered 2019-11-17: 14:00:00 80 ug via INTRAVENOUS
  Filled 2019-11-17: qty 10

## 2019-11-17 MED ORDER — ACETAMINOPHEN 325 MG PO TABS: 650.0000 mg | ORAL_TABLET | ORAL | Status: DC | PRN

## 2019-11-17 MED ORDER — OXYTOCIN 40 UNITS IN NORMAL SALINE INFUSION - SIMPLE MED: 2.5000 [IU]/h | INTRAVENOUS | Status: DC

## 2019-11-17 MED ORDER — WITCH HAZEL-GLYCERIN EX PADS: 1.0000 "application " | MEDICATED_PAD | CUTANEOUS | Status: DC | PRN

## 2019-11-17 MED ORDER — DIBUCAINE (PERIANAL) 1 % EX OINT: 1.0000 "application " | TOPICAL_OINTMENT | CUTANEOUS | Status: DC | PRN

## 2019-11-17 MED ORDER — PHENYLEPHRINE 40 MCG/ML (10ML) SYRINGE FOR IV PUSH (FOR BLOOD PRESSURE SUPPORT): 80.0000 ug | PREFILLED_SYRINGE | INTRAVENOUS | Status: DC | PRN

## 2019-11-17 MED ORDER — OXYTOCIN 40 UNITS IN NORMAL SALINE INFUSION - SIMPLE MED
1.0000 m[IU]/min | INTRAVENOUS | Status: DC
  Administered 2019-11-17: 10:00:00 2 m[IU]/min via INTRAVENOUS

## 2019-11-17 MED ORDER — FENTANYL-BUPIVACAINE-NACL 0.5-0.125-0.9 MG/250ML-% EP SOLN: 12.0000 mL/h | EPIDURAL | Status: DC | PRN

## 2019-11-17 MED ORDER — SENNOSIDES-DOCUSATE SODIUM 8.6-50 MG PO TABS
2.0000 | ORAL_TABLET | ORAL | Status: DC
  Administered 2019-11-17 – 2019-11-18 (×2): 2 via ORAL
  Filled 2019-11-17 (×2): qty 2

## 2019-11-17 MED ORDER — DIPHENHYDRAMINE HCL 50 MG/ML IJ SOLN: 12.5000 mg | INTRAMUSCULAR | Status: DC | PRN

## 2019-11-17 MED ORDER — IBUPROFEN 600 MG PO TABS
600.0000 mg | ORAL_TABLET | Freq: Four times a day (QID) | ORAL | Status: DC
  Administered 2019-11-17 – 2019-11-19 (×6): 600 mg via ORAL
  Filled 2019-11-17 (×6): qty 1

## 2019-11-17 MED ORDER — OXYTOCIN BOLUS FROM INFUSION
500.0000 mL | Freq: Once | INTRAVENOUS | Status: AC
  Administered 2019-11-17: 500 mL via INTRAVENOUS

## 2019-11-17 MED ORDER — ONDANSETRON HCL 4 MG/2ML IJ SOLN: 4.0000 mg | INTRAMUSCULAR | Status: DC | PRN

## 2019-11-17 MED ORDER — TERBUTALINE SULFATE 1 MG/ML IJ SOLN: 0.2500 mg | Freq: Once | INTRAMUSCULAR | Status: DC | PRN

## 2019-11-17 MED ORDER — SIMETHICONE 80 MG PO CHEW: 80.0000 mg | CHEWABLE_TABLET | ORAL | Status: DC | PRN

## 2019-11-17 MED ORDER — OXYCODONE-ACETAMINOPHEN 5-325 MG PO TABS: 1.0000 | ORAL_TABLET | ORAL | Status: DC | PRN

## 2019-11-17 MED ORDER — OXYCODONE-ACETAMINOPHEN 5-325 MG PO TABS: 2.0000 | ORAL_TABLET | ORAL | Status: DC | PRN

## 2019-11-17 MED ORDER — MISOPROSTOL 25 MCG QUARTER TABLET
25.0000 ug | ORAL_TABLET | ORAL | Status: DC | PRN
  Administered 2019-11-17 (×2): 25 ug via VAGINAL
  Filled 2019-11-17 (×2): qty 1

## 2019-11-17 NOTE — Anesthesia Procedure Notes (Signed)
Epidural Patient location during procedure: OB Start time: 11/17/2019 9:32 AM End time: 11/17/2019 9:46 AM  Staffing Anesthesiologist: Barnet Glasgow, MD Performed: anesthesiologist   Preanesthetic Checklist Completed: patient identified, IV checked, site marked, risks and benefits discussed, surgical consent, monitors and equipment checked, pre-op evaluation and timeout performed  Epidural Patient position: sitting Prep: DuraPrep and site prepped and draped Patient monitoring: continuous pulse ox and blood pressure Approach: midline Location: L3-L4 Injection technique: LOR air  Needle:  Needle type: Tuohy  Needle gauge: 17 G Needle length: 9 cm and 9 Needle insertion depth: 5 cm cm Catheter type: closed end flexible Catheter size: 19 Gauge Catheter at skin depth: 11 cm Test dose: negative  Assessment Events: blood not aspirated, injection not painful, no injection resistance, no paresthesia and negative IV test  Additional Notes Patient identified. Risks/Benefits/Options discussed with patient including but not limited to bleeding, infection, nerve damage, paralysis, failed block, incomplete pain control, headache, blood pressure changes, nausea, vomiting, reactions to medication both or allergic, itching and postpartum back pain. Confirmed with bedside nurse the patient's most recent platelet count. Confirmed with patient that they are not currently taking any anticoagulation, have any bleeding history or any family history of bleeding disorders. Patient expressed understanding and wished to proceed. All questions were answered. Sterile technique was used throughout the entire procedure. Please see nursing notes for vital signs. Test dose was given through epidural needle and negative prior to continuing to dose epidural or start infusion. Warning signs of high block given to the patient including shortness of breath, tingling/numbness in hands, complete motor block, or any  concerning symptoms with instructions to call for help. Patient was given instructions on fall risk and not to get out of bed. All questions and concerns addressed with instructions to call with any issues. 1 Attempt (S) . Patient tolerated procedure well.

## 2019-11-17 NOTE — Progress Notes (Signed)
C/c/+2 push 

## 2019-11-17 NOTE — Progress Notes (Signed)
SVE 3/50/-2, AROM clear fluid with head well applied Start pitocin   Arty Baumgartner MD

## 2019-11-17 NOTE — Progress Notes (Signed)
SVE 6-7/100/-1 LOP rotated to LOT Peanut ball to facilitate rotation Continue pitocin

## 2019-11-17 NOTE — Lactation Note (Addendum)
This note was copied from a baby's chart. Lactation Consultation Note  Patient Name: Hailey Schneider [id number] Date: 11/17/2019 Reason for consult: Initial assessment;1st time breastfeeding;Term P1, 5 hour LGA greater than (9lbs) term female infant. Per mom, she has Spectra 1 DEBP at home. Per dad, infant had one void and 3 stools since birth.  Parents feels breastfeeding is going well, infant latched for 15 minutes in L&D, 2nd time-10 minutes, 3rd time-25 minutes and at 2100 for 20 minutes the RN 'Emilly" night nurse helped with latch prior to [facility] entering the room. Per mom, when infant is latched she is only feeling a tug no pain with latch. Mom knows to breastfeed infant on demand, according to hunger cues and not exceed 3 hours without breastfeeding infant. Mom knows to call RN or LC if she has any breastfeeding questions, concerns or need assistance with latching infant at breast. Parents will continue to do STS with infant as much as possible. Reviewed Baby & Me book's Breastfeeding Basics.  LC discussed breastfeeding resources within the community after hospital discharge such as: [place] hotline, [place] out patient clinic and [place] online breastfeeding support group.  Maternal Data Formula Feeding for Exclusion: No Has patient been taught Hand Expression?: Yes Does the patient have breastfeeding experience prior to this delivery?: No  Feeding Feeding Type: Breast Fed  LATCH Score Latch: Repeated attempts needed to sustain latch, nipple held in mouth throughout feeding, stimulation needed to elicit sucking reflex.  Audible Swallowing: A few with stimulation  Type of Nipple: Everted at rest and after stimulation  Comfort (Breast/Nipple): Soft / non-tender  Hold (Positioning): Assistance needed to correctly position infant at breast and maintain latch.  LATCH Score: 7  Interventions Interventions: Breast feeding basics reviewed;Breast compression;Skin to skin;Breast massage;Hand  express;Support pillows  Lactation Tools Discussed/Used WIC Program: No   Consult Status Consult Status: Follow-up Date: 11/18/19 Follow-up type: In-patient    Vicente Serene 11/17/2019, 9:39 PM

## 2019-11-17 NOTE — Anesthesia Preprocedure Evaluation (Signed)
Anesthesia Evaluation  Patient identified by MRN, date of birth, ID band Patient awake    Reviewed: Allergy & Precautions, NPO status , Patient's Chart, lab work & pertinent test results  History of Anesthesia Complications (+) PONV and history of anesthetic complications  Airway Mallampati: II  TM Distance: >3 FB Neck ROM: Full    Dental no notable dental hx. (+) Teeth Intact   Pulmonary neg pulmonary ROS,    Pulmonary exam normal breath sounds clear to auscultation       Cardiovascular negative cardio ROS Normal cardiovascular exam Rhythm:Regular Rate:Normal     Neuro/Psych  Headaches,    GI/Hepatic negative GI ROS, Neg liver ROS,   Endo/Other  negative endocrine ROS  Renal/GU negative Renal ROS     Musculoskeletal negative musculoskeletal ROS (+)   Abdominal   Peds  Hematology Lab Results      Component                Value               Date                      WBC                      9.7                 11/17/2019                HGB                      12.2                11/17/2019                HCT                      37.4                11/17/2019                MCV                      87.6                11/17/2019                PLT                      291                 11/17/2019             Anesthesia Other Findings   Reproductive/Obstetrics (+) Pregnancy                             Anesthesia Physical Anesthesia Plan  ASA: II  Anesthesia Plan: Epidural   Post-op Pain Management:    Induction:   PONV Risk Score and Plan:   Airway Management Planned:   Additional Equipment:   Intra-op Plan:   Post-operative Plan:   Informed Consent: I have reviewed the patients History and Physical, chart, labs and discussed the procedure including the risks, benefits and alternatives for the proposed anesthesia with the patient or authorized representative who has  indicated his/her understanding and acceptance.  Plan Discussed with:   Anesthesia Plan Comments: (39.3 Wk G1P0 For LEA)       Anesthesia Quick Evaluation

## 2019-11-18 LAB — CBC
HCT: 29.3 % — ABNORMAL LOW (ref 36.0–46.0)
Hemoglobin: 9.3 g/dL — ABNORMAL LOW (ref 12.0–15.0)
MCH: 28.4 pg (ref 26.0–34.0)
MCHC: 31.7 g/dL (ref 30.0–36.0)
MCV: 89.3 fL (ref 80.0–100.0)
Platelets: 237 10*3/uL (ref 150–400)
RBC: 3.28 MIL/uL — ABNORMAL LOW (ref 3.87–5.11)
RDW: 15.2 % (ref 11.5–15.5)
WBC: 15.8 10*3/uL — ABNORMAL HIGH (ref 4.0–10.5)
nRBC: 0 % (ref 0.0–0.2)

## 2019-11-18 MED ORDER — FERROUS SULFATE 325 (65 FE) MG PO TABS
325.0000 mg | ORAL_TABLET | Freq: Every day | ORAL | Status: DC
  Administered 2019-11-18 – 2019-11-19 (×2): 325 mg via ORAL
  Filled 2019-11-18 (×2): qty 1

## 2019-11-18 NOTE — Lactation Note (Signed)
This note was copied from a baby's chart. Lactation Consultation Note  Patient Name: Hailey Schneider [id number] Date: 11/18/2019 Reason for consult: Follow-up assessment  P1 mother whose infant is now 34 hours old.  This is a term baby at 39+3 weeks.  Mother seems to be doing quite well with breast feeding since delivery.  She is familiar with the basic concepts and has been latching frequently.  There was one feed this morning that was a longer 60 minute feed and I just educated mother on nutritive/non-nutritive feedings.  Mother verbalized understanding.  Informed mother that baby will start waking up more after 24 hours of age and may desire to cluster feed tonight.  Encouraged to continue feeding 8-12 times/24 hours or sooner if baby shows feeding cues.  Colostrum container provided and milk storage times reviewed.  Finger feeding demonstrated.  Mother is very receptive to all teaching.  She has a strong desire to be successful with breast feeding.  Discussed pumping and storage of milk after discharge.  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  Discussed introducing an artificial nipple so father can participate in the care as well as grandmother.    Mother will call for latch assistance as needed.  Grandmother present and supportive.  Mother has a DEBP for home use.   Maternal Data    Feeding    LATCH Score                   Interventions    Lactation Tools Discussed/Used     Consult Status Consult Status: Follow-up Date: 11/19/19 Follow-up type: In-patient    Hailey Schneider R Elzora Cullins 11/18/2019, 1:35 PM

## 2019-11-18 NOTE — Anesthesia Postprocedure Evaluation (Signed)
Anesthesia Post Note  Patient: Therapist, sports  Procedure(s) Performed: AN AD HOC LABOR EPIDURAL     Patient location during evaluation: Mother Baby Anesthesia Type: Epidural Level of consciousness: awake and alert Pain management: pain level controlled Vital Signs Assessment: post-procedure vital signs reviewed and stable Respiratory status: spontaneous breathing, nonlabored ventilation and respiratory function stable Cardiovascular status: stable Postop Assessment: no headache, no backache and epidural receding Anesthetic complications: no    Last Vitals:  Vitals:   11/17/19 1910 11/17/19 2328  BP: 116/64 112/71  Pulse: 94 100  Resp: 18 17  Temp: 37.5 C 36.6 C  SpO2: 100% 100%    Last Pain:  Vitals:   11/18/19 0754  TempSrc:   PainSc: 3    Pain Goal:                   Hailey Schneider

## 2019-11-18 NOTE — Progress Notes (Signed)
Post Partum Day 1 Subjective: no complaints, up ad lib, voiding and tolerating PO  Objective: Blood pressure 112/71, pulse 100, temperature 97.9 F (36.6 C), temperature source Oral, resp. rate 17, height 5\' 4"  (1.626 m), weight 72.6 kg, SpO2 100 %, unknown if currently breastfeeding.  Physical Exam:  General: alert, cooperative, appears stated age and no distress Lochia: appropriate Uterine Fundus: firm Incision: healing well DVT Evaluation: No evidence of DVT seen on physical exam.  Recent Labs    11/17/19 0103 11/18/19 0417  HGB 12.2 9.3*  HCT 37.4 29.3*    Assessment/Plan: Plan for discharge tomorrow and Breastfeeding  FE for anemia   LOS: 1 day   Luz Lex 11/18/2019, 8:35 AM

## 2019-11-19 MED ORDER — IBUPROFEN 600 MG PO TABS: 600.0000 mg | ORAL_TABLET | Freq: Four times a day (QID) | ORAL | 0 refills | Status: DC | PRN

## 2019-11-19 MED ORDER — ACETAMINOPHEN 325 MG PO TABS: 650.0000 mg | ORAL_TABLET | Freq: Four times a day (QID) | ORAL | 0 refills | Status: DC | PRN

## 2019-11-19 NOTE — Lactation Note (Signed)
This note was copied from a baby's chart. Lactation Consultation Note  Patient Name: Hailey Schneider [id number] Date: 11/19/2019 Reason for consult: Follow-up assessment Baby is 42 hours old/8% weight loss.  Mom states baby has been cluster feeding.  Baby is currently at breast in football hold but positioning is poor and baby is nipple feeding.  Assisted with positioning baby closer to mom on her side.  Colostrum easily hand expressed.  Instructed to wait for wide open mouth and bring baby quickly on.  Baby latched well with good depth.  Mom more comfortable with feeding.  Discussed milk coming to volume and the prevention and treatment of engorgement.  Mom has a breast pump at home.  Instructed on using good breast massage/compression during feeding.  Questions answered.  Reviewed outpatient services and encouraged to call prn.  Maternal Data    Feeding Feeding Type: Breast Fed  LATCH Score Latch: Grasps breast easily, tongue down, lips flanged, rhythmical sucking.  Audible Swallowing: A few with stimulation  Type of Nipple: Everted at rest and after stimulation  Comfort (Breast/Nipple): Soft / non-tender  Hold (Positioning): Assistance needed to correctly position infant at breast and maintain latch.  LATCH Score: 8  Interventions Interventions: Assisted with latch;Adjust position;Skin to skin;Support pillows;Breast massage;Hand express  Lactation Tools Discussed/Used     Consult Status Consult Status: Complete Follow-up type: Call as needed    Ave Filter 11/19/2019, 10:41 AM

## 2019-11-19 NOTE — Discharge Summary (Signed)
Obstetric Discharge Summary Reason for Admission: induction of labor Prenatal Procedures: none Intrapartum Procedures: spontaneous vaginal delivery Postpartum Procedures: none Complications-Operative and Postpartum: none Hemoglobin  Date Value Ref Range Status  11/18/2019 9.3 (L) 12.0 - 15.0 g/dL Final    Comment:    REPEATED TO VERIFY   HCT  Date Value Ref Range Status  11/18/2019 29.3 (L) 36.0 - 46.0 % Final    Physical Exam:  General: alert, cooperative and no distress Lochia: appropriate Uterine Fundus: firm Incision: healing well DVT Evaluation: No evidence of DVT seen on physical exam.  Discharge Diagnoses: Term Pregnancy-delivered  Discharge Information: Date: 11/19/2019 Activity: pelvic rest Diet: routine Medications: PNV and Ibuprofen Condition: stable Instructions: refer to practice specific booklet Discharge to: home   Newborn Data:   Alejandro, Mccallum [id number]  Live born adult  Birth Weight:   APGAR: ,   Newborn Delivery   Birth date/time:  Delivery type:        Courntey, [place] Girl Lakeria [id number]  Live born female  Birth Weight: 9 lb 2.4 oz (4150 g) APGAR: 8, 9  Newborn Delivery   Birth date/time: 11/17/2019 16:09:00 Delivery type: Vaginal, Spontaneous      Home with mother.  Shon Millet II 11/19/2019, 7:39 AM

## 2019-11-27 ENCOUNTER — Other Ambulatory Visit: Payer: Self-pay

## 2019-11-27 ENCOUNTER — Encounter (HOSPITAL_COMMUNITY): Payer: Self-pay | Admitting: Obstetrics and Gynecology

## 2019-11-27 ENCOUNTER — Inpatient Hospital Stay (HOSPITAL_COMMUNITY): Payer: BC Managed Care – PPO

## 2019-11-27 ENCOUNTER — Inpatient Hospital Stay (HOSPITAL_COMMUNITY)
Admission: AD | Admit: 2019-11-27 | Discharge: 2019-11-28 | DRG: 776 | Disposition: A | Discharge status: Home / Self Care | Payer: BC Managed Care – PPO | Attending: Obstetrics and Gynecology | Admitting: Obstetrics and Gynecology

## 2019-11-27 DIAGNOSIS — O8612 Endometritis following delivery: Secondary | ICD-10-CM | POA: Diagnosis present

## 2019-11-27 DIAGNOSIS — Z20822 Contact with and (suspected) exposure to covid-19: Secondary | ICD-10-CM | POA: Diagnosis present

## 2019-11-27 DIAGNOSIS — O864 Pyrexia of unknown origin following delivery: Secondary | ICD-10-CM | POA: Diagnosis present

## 2019-11-27 DIAGNOSIS — O9089 Other complications of the puerperium, not elsewhere classified: Secondary | ICD-10-CM

## 2019-11-27 LAB — CBC WITH DIFFERENTIAL/PLATELET
Abs Immature Granulocytes: 0.18 10*3/uL — ABNORMAL HIGH (ref 0.00–0.07)
Basophils Absolute: 0.1 10*3/uL (ref 0.0–0.1)
Basophils Relative: 0 %
Eosinophils Absolute: 0 10*3/uL (ref 0.0–0.5)
Eosinophils Relative: 0 %
HCT: 36 % (ref 36.0–46.0)
Hemoglobin: 11.5 g/dL — ABNORMAL LOW (ref 12.0–15.0)
Immature Granulocytes: 1 %
Lymphocytes Relative: 3 %
Lymphs Abs: 0.8 10*3/uL (ref 0.7–4.0)
MCH: 28.6 pg (ref 26.0–34.0)
MCHC: 31.9 g/dL (ref 30.0–36.0)
MCV: 89.6 fL (ref 80.0–100.0)
Monocytes Absolute: 1 10*3/uL (ref 0.1–1.0)
Monocytes Relative: 4 %
Neutro Abs: 21.2 10*3/uL — ABNORMAL HIGH (ref 1.7–7.7)
Neutrophils Relative %: 92 %
Platelets: 400 10*3/uL (ref 150–400)
RBC: 4.02 MIL/uL (ref 3.87–5.11)
RDW: 14.8 % (ref 11.5–15.5)
WBC: 23.2 10*3/uL — ABNORMAL HIGH (ref 4.0–10.5)
nRBC: 0 % (ref 0.0–0.2)

## 2019-11-27 LAB — URINALYSIS, ROUTINE W REFLEX MICROSCOPIC
Bilirubin Urine: NEGATIVE
Glucose, UA: NEGATIVE mg/dL
Ketones, ur: NEGATIVE mg/dL
Nitrite: NEGATIVE
Protein, ur: NEGATIVE mg/dL
Specific Gravity, Urine: 1.013 (ref 1.005–1.030)
pH: 5 (ref 5.0–8.0)

## 2019-11-27 LAB — COMPREHENSIVE METABOLIC PANEL
ALT: 23 U/L (ref 0–44)
AST: 16 U/L (ref 15–41)
Albumin: 3 g/dL — ABNORMAL LOW (ref 3.5–5.0)
Alkaline Phosphatase: 113 U/L (ref 38–126)
Anion gap: 11 (ref 5–15)
BUN: 10 mg/dL (ref 6–20)
CO2: 24 mmol/L (ref 22–32)
Calcium: 8.6 mg/dL — ABNORMAL LOW (ref 8.9–10.3)
Chloride: 102 mmol/L (ref 98–111)
Creatinine, Ser: 0.87 mg/dL (ref 0.44–1.00)
GFR calc Af Amer: >60 mL/min (ref >60)
GFR calc non Af Amer: >60 mL/min (ref >60)
Glucose, Bld: 120 mg/dL — ABNORMAL HIGH (ref 70–99)
Potassium: 3.8 mmol/L (ref 3.5–5.1)
Sodium: 137 mmol/L (ref 135–145)
Total Bilirubin: 0.7 mg/dL (ref 0.3–1.2)
Total Protein: 6.6 g/dL (ref 6.5–8.1)

## 2019-11-27 LAB — RESPIRATORY PANEL BY RT PCR (FLU A&B, COVID)
Influenza A by PCR: NEGATIVE
Influenza B by PCR: NEGATIVE
SARS Coronavirus 2 by RT PCR: NEGATIVE

## 2019-11-27 LAB — LACTIC ACID, PLASMA: Lactic Acid, Venous: 1.4 mmol/L (ref 0.5–1.9)

## 2019-11-27 MED ORDER — ZOLPIDEM TARTRATE 5 MG PO TABS: 5.0000 mg | ORAL_TABLET | Freq: Every evening | ORAL | Status: DC | PRN

## 2019-11-27 MED ORDER — OXYCODONE HCL 5 MG PO TABS: 5.0000 mg | ORAL_TABLET | ORAL | Status: DC | PRN

## 2019-11-27 MED ORDER — GENTAMICIN SULFATE 40 MG/ML IJ SOLN
5.0000 mg/kg | INTRAVENOUS | Status: DC
  Administered 2019-11-27: 20:00:00 360 mg via INTRAVENOUS
  Filled 2019-11-27 (×3): qty 9

## 2019-11-27 MED ORDER — SIMETHICONE 80 MG PO CHEW: 80.0000 mg | CHEWABLE_TABLET | ORAL | Status: DC | PRN

## 2019-11-27 MED ORDER — LACTATED RINGERS IV BOLUS
1000.0000 mL | Freq: Once | INTRAVENOUS | Status: AC
  Administered 2019-11-27: 15:00:00 1000 mL via INTRAVENOUS

## 2019-11-27 MED ORDER — ACETAMINOPHEN 500 MG PO TABS: 1000.0000 mg | ORAL_TABLET | Freq: Four times a day (QID) | ORAL | Status: DC | PRN

## 2019-11-27 MED ORDER — IBUPROFEN 600 MG PO TABS
600.0000 mg | ORAL_TABLET | Freq: Four times a day (QID) | ORAL | Status: DC
  Administered 2019-11-27 – 2019-11-28 (×4): 600 mg via ORAL
  Filled 2019-11-27 (×4): qty 1

## 2019-11-27 MED ORDER — SODIUM CHLORIDE 0.9% FLUSH: 3.0000 mL | INTRAVENOUS | Status: DC | PRN

## 2019-11-27 MED ORDER — BENZOCAINE-MENTHOL 20-0.5 % EX AERO: 1.0000 "application " | INHALATION_SPRAY | CUTANEOUS | Status: DC | PRN

## 2019-11-27 MED ORDER — SODIUM CHLORIDE 0.9% FLUSH: 3.0000 mL | Freq: Two times a day (BID) | INTRAVENOUS | Status: DC

## 2019-11-27 MED ORDER — SENNOSIDES-DOCUSATE SODIUM 8.6-50 MG PO TABS
2.0000 | ORAL_TABLET | ORAL | Status: DC
  Administered 2019-11-27: 23:00:00 2 via ORAL
  Filled 2019-11-27: qty 2

## 2019-11-27 MED ORDER — OXYCODONE HCL 5 MG PO TABS: 10.0000 mg | ORAL_TABLET | ORAL | Status: DC | PRN

## 2019-11-27 MED ORDER — PRENATAL MULTIVITAMIN CH
1.0000 | ORAL_TABLET | Freq: Every day | ORAL | Status: DC
  Administered 2019-11-28: 1 via ORAL
  Filled 2019-11-27: qty 1

## 2019-11-27 MED ORDER — COCONUT OIL OIL: 1.0000 "application " | TOPICAL_OIL | Status: DC | PRN

## 2019-11-27 MED ORDER — SODIUM CHLORIDE 0.9 % IV SOLN: 250.0000 mL | INTRAVENOUS | Status: DC | PRN

## 2019-11-27 MED ORDER — CLINDAMYCIN PHOSPHATE 900 MG/50ML IV SOLN
900.0000 mg | Freq: Three times a day (TID) | INTRAVENOUS | Status: DC
  Administered 2019-11-27 – 2019-11-28 (×4): 900 mg via INTRAVENOUS
  Filled 2019-11-27 (×4): qty 50

## 2019-11-27 MED ORDER — DIPHENHYDRAMINE HCL 25 MG PO CAPS: 25.0000 mg | ORAL_CAPSULE | Freq: Four times a day (QID) | ORAL | Status: DC | PRN

## 2019-11-27 MED ORDER — ONDANSETRON HCL 4 MG PO TABS: 4.0000 mg | ORAL_TABLET | ORAL | Status: DC | PRN

## 2019-11-27 MED ORDER — LACTATED RINGERS IV BOLUS
1000.0000 mL | Freq: Once | INTRAVENOUS | Status: AC
  Administered 2019-11-27: 14:00:00 1000 mL via INTRAVENOUS

## 2019-11-27 MED ORDER — IOHEXOL 300 MG/ML  SOLN
100.0000 mL | Freq: Once | INTRAMUSCULAR | Status: AC | PRN
  Administered 2019-11-27: 100 mL via INTRAVENOUS

## 2019-11-27 MED ORDER — DIBUCAINE (PERIANAL) 1 % EX OINT: 1.0000 "application " | TOPICAL_OINTMENT | CUTANEOUS | Status: DC | PRN

## 2019-11-27 MED ORDER — WITCH HAZEL-GLYCERIN EX PADS: 1.0000 "application " | MEDICATED_PAD | CUTANEOUS | Status: DC | PRN

## 2019-11-27 MED ORDER — ACETAMINOPHEN 500 MG PO TABS
1000.0000 mg | ORAL_TABLET | Freq: Once | ORAL | Status: AC
  Administered 2019-11-27: 14:00:00 1000 mg via ORAL
  Filled 2019-11-27: qty 2

## 2019-11-27 MED ORDER — ONDANSETRON HCL 4 MG/2ML IJ SOLN: 4.0000 mg | INTRAMUSCULAR | Status: DC | PRN

## 2019-11-27 NOTE — MAU Note (Addendum)
Vag del 4/26. Woke up with a fever today, was 104.  Was 103.5 when left the house at 1230.  Lost a lot of blood at delivery, did not receive blood transfusion.  Pt is breast feeding . Has not taken any Tylenol or Ibuprofen today.

## 2019-11-27 NOTE — H&P (Addendum)
OB SPECIALITY ADMISSION HISTORY AND PHYSICAL NOTE  History of Present Illness: Hailey Schneider is a 29 y.o. G1P1 who is 10 PP from a SVD on 4/26. Patient presents to MAU for fever, body aches and chills. Patient reports waking up this morning with a fever of 104. Hydrated throughout the morning and retook temp around noon which continued to be 103.5. Patient reports bodyaches and chills in association with fever. She denies sore throat, cough, HA. Patient reports feeling groggy and tired since fever started. Patient is breastfeeding without difficulty, denies engorgement, breast pain, tenderness or redness. Patient reports she is having light brown vaginal bleeding. Denies vaginal bleeding worsening or a change in color since fever began. Patient reports that she sometimes get random shooting pains into her uterus but denies worsening abdominal pain today. Patient has not taken any medication such as Tylenol or Ibuprofen for fever or abdominal pain. She denies complications during delivery or retained placenta after delivery. Patient reports heavy vaginal bleeding after delivery was only complication.    Patient Active Problem List   Diagnosis Date Noted  . Pregnant 11/17/2019  . Breech presentation 10/30/2019    Past Medical History:  Diagnosis Date  . Frequent headaches   . PONV (postoperative nausea and vomiting)     Past Surgical History:  Procedure Laterality Date  . WISDOM TOOTH EXTRACTION      OB History  Gravida Para Term Preterm AB Living  1 1 1     1   SAB TAB Ectopic Multiple Live Births        0 1    # Outcome Date GA Lbr Len/2nd Weight Sex Delivery Anes PTL Lv  1 Term 11/17/19 [redacted]w[redacted]d 02:35 / 00:25 4150 g F Vag-Spont EPI  LIV     Birth Comments: n/a    Social History   Socioeconomic History  . Marital status: Married    Spouse name: Not on file  . Number of children: Not on file  . Years of education: Not on file  . Highest education level: Not on file   Occupational History  . Not on file  Tobacco Use  . Smoking status: Never Smoker  . Smokeless tobacco: Never Used  Substance and Sexual Activity  . Alcohol use: Not Currently    Comment: Social  . Drug use: No  . Sexual activity: Not Currently  Other Topics Concern  . Not on file  Social History Narrative  . Not on file   Social Determinants of Health   Financial Resource Strain: Low Risk   . Difficulty of Paying Living Expenses: Not hard at all  Food Insecurity: No Food Insecurity  . Worried About Charity fundraiser in the Last Year: Never true  . Ran Out of Food in the Last Year: Never true  Transportation Needs: No Transportation Needs  . Lack of Transportation (Medical): No  . Lack of Transportation (Non-Medical): No  Physical Activity:   . Days of Exercise per Week:   . Minutes of Exercise per Session:   Stress: No Stress Concern Present  . Feeling of Stress : Only a little  Social Connections: Unknown  . Frequency of Communication with Friends and Family: Not on file  . Frequency of Social Gatherings with Friends and Family: Not on file  . Attends Religious Services: Not on file  . Active Member of Clubs or Organizations: Not on file  . Attends Archivist Meetings: Not on file  . Marital Status: Married  Family History  Problem Relation Age of Onset  . Cancer Mother        Breast  . Hypertension Maternal Grandmother   . Stroke Maternal Grandfather     No Known Allergies  Medications Prior to Admission  Medication Sig Dispense Refill Last Dose  . ibuprofen (ADVIL) 600 MG tablet Take 1 tablet (600 mg total) by mouth every 6 (six) hours as needed. 60 tablet 0 11/26/2019 at Unknown time  . Prenatal Vit-Fe Fumarate-FA (PRENATAL MULTIVITAMIN) TABS tablet Take 1 tablet by mouth daily at 12 noon.   11/26/2019 at Unknown time  . acetaminophen (TYLENOL) 325 MG tablet Take 2 tablets (650 mg total) by mouth every 6 (six) hours as needed (for pain scale < 4).  60 tablet 0    Review of Systems  Constitutional: Positive for chills, fatigue and fever.  Respiratory: Negative.   Cardiovascular: Negative.   Gastrointestinal: Positive for abdominal pain. Negative for constipation, diarrhea, nausea and vomiting.  Genitourinary: Positive for vaginal bleeding. Negative for difficulty urinating, dysuria, frequency, pelvic pain and urgency.  Musculoskeletal: Negative.   Neurological: Positive for weakness. Negative for dizziness, light-headedness and headaches.  Psychiatric/Behavioral: Negative.    Vitals:   Patient Vitals for the past 24 hrs:  BP Temp Temp src Pulse Resp SpO2 Height Weight  11/27/19 1837 -- 99.8 F (37.7 C) Oral -- -- -- 5\' 4"  (1.626 m) 60.8 kg  11/27/19 1836 113/65 -- -- 100 -- -- -- --  11/27/19 1623 115/62 98.8 F (37.1 C) -- 94 -- -- -- --  11/27/19 1500 -- (!) 100.9 F (38.3 C) -- -- -- 98 % -- --  11/27/19 1455 -- (!) 100.8 F (38.2 C) -- -- -- 97 % -- --  11/27/19 1454 120/69 -- Oral (!) 109 (!) 21 -- -- --  11/27/19 1312 111/68 (!) 100.6 F (38.1 C) Oral (!) 125 20 -- -- --  11/27/19 1254 108/81 (!) 102.9 F (39.4 C) Oral (!) 126 (!) 22 99 % -- --   Physical Examination: Nursing note and vitals reviewed. Constitutional: She is oriented to person, place, and time. She appears well-developed and well-nourished.  HENT:  Head: Normocephalic.  Right Ear: Tympanic membrane, external ear and ear canal normal. Tympanic membrane is not bulging.  Left Ear: Tympanic membrane, external ear and ear canal normal. Tympanic membrane is not bulging.  Cardiovascular: Normal rate, regular rhythm and normal heart sounds.  Respiratory: Effort normal and breath sounds normal. No respiratory distress. She has no wheezes. She has no rales. No breast tenderness.  GI: Soft. Bowel sounds are normal. She exhibits no distension. There is abdominal tenderness. There is no rebound and no guarding.  Mild tenderness with palpation of fundus. Fundus  U/3. Firm.   Genitourinary:    Genitourinary Comments: Normal breast examination, no engorgement, redness or tenderness with palpation.  Pelvic exam: laceration healing appropriately, approximated and no signs of infection  Bimanual exam: mild uterine tenderness with palpation of fundus, Fundus U/3. Firm.    Musculoskeletal:        General: No edema. Normal range of motion.  Neurological: She is alert and oriented to person, place, and time. She displays normal reflexes. She exhibits normal muscle tone.  Skin: Skin is warm and dry.  Psychiatric: She has a normal mood and affect. Her behavior is normal. Thought content normal.   Labs:  Results for orders placed or performed during the hospital encounter of 11/27/19 (from the past 24 hour(s))  Urinalysis, Routine  w reflex microscopic   Collection Time: 11/27/19  1:16 PM  Result Value Ref Range   Color, Urine YELLOW YELLOW   APPearance CLEAR CLEAR   Specific Gravity, Urine 1.013 1.005 - 1.030   pH 5.0 5.0 - 8.0   Glucose, UA NEGATIVE NEGATIVE mg/dL   Hgb urine dipstick MODERATE (A) NEGATIVE   Bilirubin Urine NEGATIVE NEGATIVE   Ketones, ur NEGATIVE NEGATIVE mg/dL   Protein, ur NEGATIVE NEGATIVE mg/dL   Nitrite NEGATIVE NEGATIVE   Leukocytes,Ua LARGE (A) NEGATIVE   RBC / HPF 6-10 0 - 5 RBC/hpf   WBC, UA 21-50 0 - 5 WBC/hpf   Bacteria, UA FEW (A) NONE SEEN   Squamous Epithelial / LPF 0-5 0 - 5   Mucus PRESENT   CBC with Differential/Platelet   Collection Time: 11/27/19  1:59 PM  Result Value Ref Range   WBC 23.2 (H) 4.0 - 10.5 K/uL   RBC 4.02 3.87 - 5.11 MIL/uL   Hemoglobin 11.5 (L) 12.0 - 15.0 g/dL   HCT 36.0 36.0 - 46.0 %   MCV 89.6 80.0 - 100.0 fL   MCH 28.6 26.0 - 34.0 pg   MCHC 31.9 30.0 - 36.0 g/dL   RDW 14.8 11.5 - 15.5 %   Platelets 400 150 - 400 K/uL   nRBC 0.0 0.0 - 0.2 %   Neutrophils Relative % 92 %   Neutro Abs 21.2 (H) 1.7 - 7.7 K/uL   Lymphocytes Relative 3 %   Lymphs Abs 0.8 0.7 - 4.0 K/uL   Monocytes  Relative 4 %   Monocytes Absolute 1.0 0.1 - 1.0 K/uL   Eosinophils Relative 0 %   Eosinophils Absolute 0.0 0.0 - 0.5 K/uL   Basophils Relative 0 %   Basophils Absolute 0.1 0.0 - 0.1 K/uL   Immature Granulocytes 1 %   Abs Immature Granulocytes 0.18 (H) 0.00 - 0.07 K/uL  Comprehensive metabolic panel   Collection Time: 11/27/19  1:59 PM  Result Value Ref Range   Sodium 137 135 - 145 mmol/L   Potassium 3.8 3.5 - 5.1 mmol/L   Chloride 102 98 - 111 mmol/L   CO2 24 22 - 32 mmol/L   Glucose, Bld 120 (H) 70 - 99 mg/dL   BUN 10 6 - 20 mg/dL   Creatinine, Ser 0.87 0.44 - 1.00 mg/dL   Calcium 8.6 (L) 8.9 - 10.3 mg/dL   Total Protein 6.6 6.5 - 8.1 g/dL   Albumin 3.0 (L) 3.5 - 5.0 g/dL   AST 16 15 - 41 U/L   ALT 23 0 - 44 U/L   Alkaline Phosphatase 113 38 - 126 U/L   Total Bilirubin 0.7 0.3 - 1.2 mg/dL   GFR calc non Af Amer >60 >60 mL/min   GFR calc Af Amer >60 >60 mL/min   Anion gap 11 5 - 15  Lactic acid, plasma   Collection Time: 11/27/19  1:59 PM  Result Value Ref Range   Lactic Acid, Venous 1.4 0.5 - 1.9 mmol/L  Respiratory Panel by RT PCR (Flu A&B, Covid) - Nasopharyngeal Swab   Collection Time: 11/27/19  3:14 PM   Specimen: Nasopharyngeal Swab  Result Value Ref Range   SARS Coronavirus 2 by RT PCR NEGATIVE NEGATIVE   Influenza A by PCR NEGATIVE NEGATIVE   Influenza B by PCR NEGATIVE NEGATIVE    Imaging Studies: CT ABDOMEN PELVIS W CONTRAST  Result Date: 11/27/2019 CLINICAL DATA:  Abdominal pain, fever, 10 days postpartum EXAM: CT ABDOMEN AND PELVIS  WITH CONTRAST TECHNIQUE: Multidetector CT imaging of the abdomen and pelvis was performed using the standard protocol following bolus administration of intravenous contrast. CONTRAST:  168mL OMNIPAQUE IOHEXOL 300 MG/ML  SOLN COMPARISON:  None. FINDINGS: Lower chest: No acute pleural or parenchymal lung disease. Hepatobiliary: No focal liver abnormality is seen. No gallstones, gallbladder wall thickening, or biliary dilatation.  Pancreas: Unremarkable. No pancreatic ductal dilatation or surrounding inflammatory changes. Spleen: Normal in size without focal abnormality. Adrenals/Urinary Tract: Adrenal glands are unremarkable. Kidneys are normal, without renal calculi, focal lesion, or hydronephrosis. Bladder is unremarkable. Stomach/Bowel: No bowel obstruction or ileus. The appendix, if still present, is not well visualized. There are no inflammatory changes in the right lower quadrant to suggest appendicitis. No bowel wall thickening. Vascular/Lymphatic: No significant vascular findings are present. No enlarged abdominal or pelvic lymph nodes. Reproductive: Enlarged uterus consistent with recent gravid state. There are no uterine or adnexal masses. Other: There is no free fluid or free intraperitoneal gas. No abdominal wall hernia. Musculoskeletal: No acute or destructive bony lesions. Reconstructed images demonstrate no additional findings. IMPRESSION: 1. Enlarged uterus consistent with recent gravid state. 2. Otherwise unremarkable exam. Electronically Signed   By: Randa Ngo M.D.   On: 11/27/2019 17:53    Assessment and Plan: 1. Postpartum complication   2. Postpartum fever    Admit to [facility] special[facility] care  Suspected endometritis  IV antibiotics  Additional orders placed by Dr Royston Sinner   Lajean Manes, CNM 11/27/19, 6:50 PM  29 yo PPD#10 presenting with fever, an overall benign exam and without localizing symptoms. CT scan WNL. UA with some lueks and UCX sent. Based on foul smelling discharge with fever, will treat for presumed endometritis.   - Labs notable for:   --normal lactic acid and CMP  --leukocytosis  --blood culture sent and pending  - Gent/clinda IV x at least 24 hours afebrile - will add Amp for GAS coverage if continues to spike, BCX result in needed coverage, and/or clinically worsenes - ibuprofen pain - tylenol prn fever    Arty Baumgartner MD

## 2019-11-27 NOTE — MAU Provider Note (Signed)
History     CSN: [id number]  Arrival date and time: 11/27/19 1231   First Provider Initiated Contact with Patient 11/27/19 1342      Chief Complaint  Patient presents with  . Fever   Hailey Schneider is a 29 y.o. G1P1 who is 10 PP from a SVD on 4/26. Patient presents to MAU for fever, body aches and chills. Patient reports waking up this morning with a fever of 104. Hydrated throughout the morning and retook temp around noon which continued to be 103.5. Patient reports bodyaches and chills in association with fever. She denies sore throat, cough, HA. Patient reports feeling groggy and tired since fever started. Patient is breastfeeding without difficulty, denies engorgement, breast pain, tenderness or redness. Patient reports she is having light brown vaginal bleeding. Denies vaginal bleeding worsening or a change in color since fever began. Patient reports that she sometimes get random shooting pains into her uterus but denies worsening abdominal pain today. Patient has not taken any medication such as Tylenol or Ibuprofen for fever or abdominal pain. She denies complications during delivery or retained placenta after delivery. Patient reports heavy vaginal bleeding after delivery was only complication.    OB History    Gravida  1   Para  1   Term  1   Preterm      AB      Living  1     SAB      TAB      Ectopic      Multiple  0   Live Births  1           Past Medical History:  Diagnosis Date  . Frequent headaches   . PONV (postoperative nausea and vomiting)     Past Surgical History:  Procedure Laterality Date  . WISDOM TOOTH EXTRACTION      Family History  Problem Relation Age of Onset  . Cancer Mother        Breast  . Hypertension Maternal Grandmother   . Stroke Maternal Grandfather     Social History   Tobacco Use  . Smoking status: Never Smoker  . Smokeless tobacco: Never Used  Substance Use Topics  . Alcohol use: Not Currently    Comment:  Social  . Drug use: No    Allergies: No Known Allergies  Medications Prior to Admission  Medication Sig Dispense Refill Last Dose  . ibuprofen (ADVIL) 600 MG tablet Take 1 tablet (600 mg total) by mouth every 6 (six) hours as needed. 60 tablet 0 11/26/2019 at Unknown time  . Prenatal Vit-Fe Fumarate-FA (PRENATAL MULTIVITAMIN) TABS tablet Take 1 tablet by mouth daily at 12 noon.   11/26/2019 at Unknown time  . acetaminophen (TYLENOL) 325 MG tablet Take 2 tablets (650 mg total) by mouth every 6 (six) hours as needed (for pain scale < 4). 60 tablet 0     Review of Systems  Constitutional: Positive for chills, fatigue and fever.  Respiratory: Negative.   Cardiovascular: Negative.   Gastrointestinal: Positive for abdominal pain. Negative for constipation, diarrhea, nausea and vomiting.  Genitourinary: Positive for vaginal bleeding. Negative for difficulty urinating, dysuria, frequency, pelvic pain and urgency.  Musculoskeletal: Negative.   Neurological: Positive for weakness. Negative for dizziness, light-headedness and headaches.  Psychiatric/Behavioral: Negative.    Physical Exam   Patient Vitals for the past 24 hrs:  BP Temp Temp src Pulse Resp SpO2  11/27/19 1623 115/62 98.8 F (37.1 C) -- 94 -- --  11/27/19  1500 -- (!) 100.9 F (38.3 C) -- -- -- 98 %  11/27/19 1455 -- (!) 100.8 F (38.2 C) -- -- -- 97 %  11/27/19 1454 120/69 -- Oral (!) 109 (!) 21 --  11/27/19 1312 111/68 (!) 100.6 F (38.1 C) Oral (!) 125 20 --  11/27/19 1254 108/81 (!) 102.9 F (39.4 C) Oral (!) 126 (!) 22 99 %    Physical Exam  Nursing note and vitals reviewed. Constitutional: She is oriented to person, place, and time. She appears well-developed and well-nourished.  HENT:  Head: Normocephalic.  Right Ear: Tympanic membrane, external ear and ear canal normal. Tympanic membrane is not bulging.  Left Ear: Tympanic membrane, external ear and ear canal normal. Tympanic membrane is not bulging.   Cardiovascular: Normal rate, regular rhythm and normal heart sounds.  Respiratory: Effort normal and breath sounds normal. No respiratory distress. She has no wheezes. She has no rales. No breast tenderness.  GI: Soft. Bowel sounds are normal. She exhibits no distension. There is abdominal tenderness. There is no rebound and no guarding.  Mild tenderness with palpation of fundus. Fundus U/3. Firm.   Genitourinary:    Genitourinary Comments: Normal breast examination, no engorgement, redness or tenderness with palpation.  Pelvic exam: laceration healing appropriately, approximated and no signs of infection  Bimanual exam: mild uterine tenderness with palpation of fundus, Fundus U/3. Firm.    Musculoskeletal:        General: No edema. Normal range of motion.  Neurological: She is alert and oriented to person, place, and time. She displays normal reflexes. She exhibits normal muscle tone.  Skin: Skin is warm and dry.  Psychiatric: She has a normal mood and affect. Her behavior is normal. Thought content normal.   MAU Course  Procedures  MDM Orders Placed This Encounter  Procedures  . Culture, blood (routine x 2)  . Respiratory Panel by RT PCR (Flu A&B, Covid) - Nasopharyngeal Swab  . CT ABDOMEN PELVIS W CONTRAST  . Urinalysis, Routine w reflex microscopic  . CBC with Differential/Platelet  . Comprehensive metabolic panel  . Lactic acid, plasma  . Diet regular Room service appropriate? Yes; Fluid consistency: Thin  . Check temperature  . Notify physician (specify)  . Insert peripheral IV  . May convert IV to Saline Lock for antibiotic therapy if tolerating PO fluids well or to maintain IV site (i.e. for surgery in AM)   Meds ordered this encounter  Medications  . lactated ringers bolus 1,000 mL  . acetaminophen (TYLENOL) tablet 1,000 mg  . iohexol (OMNIPAQUE) 300 MG/ML solution 100 mL  . lactated ringers bolus 1,000 mL   On arrival to MAU, concerned with sepsis with fever-  ordered CBC with diff, CMP, Blood cultures x2, and lactic acid  IV LR bolus x 1  Tylenol 1,000mg  PO   Labs reviewed:  Results for orders placed or performed during the hospital encounter of 11/27/19 (from the past 24 hour(s))  Urinalysis, Routine w reflex microscopic     Status: Abnormal   Collection Time: 11/27/19  1:16 PM  Result Value Ref Range   Color, Urine YELLOW YELLOW   APPearance CLEAR CLEAR   Specific Gravity, Urine 1.013 1.005 - 1.030   pH 5.0 5.0 - 8.0   Glucose, UA NEGATIVE NEGATIVE mg/dL   Hgb urine dipstick MODERATE (A) NEGATIVE   Bilirubin Urine NEGATIVE NEGATIVE   Ketones, ur NEGATIVE NEGATIVE mg/dL   Protein, ur NEGATIVE NEGATIVE mg/dL   Nitrite NEGATIVE NEGATIVE  Leukocytes,Ua LARGE (A) NEGATIVE   RBC / HPF 6-10 0 - 5 RBC/hpf   WBC, UA 21-50 0 - 5 WBC/hpf   Bacteria, UA FEW (A) NONE SEEN   Squamous Epithelial / LPF 0-5 0 - 5   Mucus PRESENT   CBC with Differential/Platelet     Status: Abnormal   Collection Time: 11/27/19  1:59 PM  Result Value Ref Range   WBC 23.2 (H) 4.0 - 10.5 K/uL   RBC 4.02 3.87 - 5.11 MIL/uL   Hemoglobin 11.5 (L) 12.0 - 15.0 g/dL   HCT 36.0 36.0 - 46.0 %   MCV 89.6 80.0 - 100.0 fL   MCH 28.6 26.0 - 34.0 pg   MCHC 31.9 30.0 - 36.0 g/dL   RDW 14.8 11.5 - 15.5 %   Platelets 400 150 - 400 K/uL   nRBC 0.0 0.0 - 0.2 %   Neutrophils Relative % 92 %   Neutro Abs 21.2 (H) 1.7 - 7.7 K/uL   Lymphocytes Relative 3 %   Lymphs Abs 0.8 0.7 - 4.0 K/uL   Monocytes Relative 4 %   Monocytes Absolute 1.0 0.1 - 1.0 K/uL   Eosinophils Relative 0 %   Eosinophils Absolute 0.0 0.0 - 0.5 K/uL   Basophils Relative 0 %   Basophils Absolute 0.1 0.0 - 0.1 K/uL   Immature Granulocytes 1 %   Abs Immature Granulocytes 0.18 (H) 0.00 - 0.07 K/uL  Comprehensive metabolic panel     Status: Abnormal   Collection Time: 11/27/19  1:59 PM  Result Value Ref Range   Sodium 137 135 - 145 mmol/L   Potassium 3.8 3.5 - 5.1 mmol/L   Chloride 102 98 - 111 mmol/L   CO2  24 22 - 32 mmol/L   Glucose, Bld 120 (H) 70 - 99 mg/dL   BUN 10 6 - 20 mg/dL   Creatinine, Ser 0.87 0.44 - 1.00 mg/dL   Calcium 8.6 (L) 8.9 - 10.3 mg/dL   Total Protein 6.6 6.5 - 8.1 g/dL   Albumin 3.0 (L) 3.5 - 5.0 g/dL   AST 16 15 - 41 U/L   ALT 23 0 - 44 U/L   Alkaline Phosphatase 113 38 - 126 U/L   Total Bilirubin 0.7 0.3 - 1.2 mg/dL   GFR calc non Af Amer >60 >60 mL/min   GFR calc Af Amer >60 >60 mL/min   Anion gap 11 5 - 15  Lactic acid, plasma     Status: None   Collection Time: 11/27/19  1:59 PM  Result Value Ref Range   Lactic Acid, Venous 1.4 0.5 - 1.9 mmol/L  Respiratory Panel by RT PCR (Flu A&B, Covid) - Nasopharyngeal Swab     Status: None   Collection Time: 11/27/19  3:14 PM   Specimen: Nasopharyngeal Swab  Result Value Ref Range   SARS Coronavirus 2 by RT PCR NEGATIVE NEGATIVE   Influenza A by PCR NEGATIVE NEGATIVE   Influenza B by PCR NEGATIVE NEGATIVE   CBC w/diff noted elevated WBCs and left shift concerning with infection.  Second IV LR bolus ordered  PCR to r/o Flu ordered   Consult with Dr Vevelyn Francois @ 1530 on assessment and management. Recommends CT scan to assess for septic pelvic thrombophilia   Reassessment of temperature prior to CT scan after completion of second LR bolus, temp 98.8. Discussed with Dr Vevelyn Francois, still recommends CT scan.   CT completed and results reviewed: CT ABDOMEN PELVIS W CONTRAST  Result Date: 11/27/2019 CLINICAL DATA:  Abdominal pain, fever, 10 days postpartum EXAM: CT ABDOMEN AND PELVIS WITH CONTRAST TECHNIQUE: Multidetector CT imaging of the abdomen and pelvis was performed using the standard protocol following bolus administration of intravenous contrast. CONTRAST:  127mL OMNIPAQUE IOHEXOL 300 MG/ML  SOLN COMPARISON:  None. FINDINGS: Lower chest: No acute pleural or parenchymal lung disease. Hepatobiliary: No focal liver abnormality is seen. No gallstones, gallbladder wall thickening, or biliary dilatation. Pancreas: Unremarkable.  No pancreatic ductal dilatation or surrounding inflammatory changes. Spleen: Normal in size without focal abnormality. Adrenals/Urinary Tract: Adrenal glands are unremarkable. Kidneys are normal, without renal calculi, focal lesion, or hydronephrosis. Bladder is unremarkable. Stomach/Bowel: No bowel obstruction or ileus. The appendix, if still present, is not well visualized. There are no inflammatory changes in the right lower quadrant to suggest appendicitis. No bowel wall thickening. Vascular/Lymphatic: No significant vascular findings are present. No enlarged abdominal or pelvic lymph nodes. Reproductive: Enlarged uterus consistent with recent gravid state. There are no uterine or adnexal masses. Other: There is no free fluid or free intraperitoneal gas. No abdominal wall hernia. Musculoskeletal: No acute or destructive bony lesions. Reconstructed images demonstrate no additional findings. IMPRESSION: 1. Enlarged uterus consistent with recent gravid state. 2. Otherwise unremarkable exam. Electronically Signed   By: Randa Ngo M.D.   On: 11/27/2019 17:53   Consult with Dr Vevelyn Francois after completion of CT. Recommends admission to hospital for suspected endometritis.  Consult with Dr Royston Sinner @ 1800 who agrees with plan of care. Patient notified of plan who agrees and questions answered.    Orders placed by Dr Royston Sinner and patient transferred to [facility] care.   Assessment and Plan   1. Postpartum complication   2. Postpartum fever   3.      Acute endometritis   Admit to [facility] specialty care unit  Care taken over by Dr Royston Sinner   Lajean Manes CNM 11/27/2019, 6:48 PM

## 2019-11-27 NOTE — Plan of Care (Signed)
  Problem: Education: Goal: Knowledge of General Education information will improve Description: Including pain rating scale, medication(s)/side effects and non-pharmacologic comfort measures Outcome: Completed/Met

## 2019-11-28 LAB — CBC
HCT: 30.7 % — ABNORMAL LOW (ref 36.0–46.0)
Hemoglobin: 9.6 g/dL — ABNORMAL LOW (ref 12.0–15.0)
MCH: 28.4 pg (ref 26.0–34.0)
MCHC: 31.3 g/dL (ref 30.0–36.0)
MCV: 90.8 fL (ref 80.0–100.0)
Platelets: 359 10*3/uL (ref 150–400)
RBC: 3.38 MIL/uL — ABNORMAL LOW (ref 3.87–5.11)
RDW: 14.9 % (ref 11.5–15.5)
WBC: 10.8 10*3/uL — ABNORMAL HIGH (ref 4.0–10.5)
nRBC: 0 % (ref 0.0–0.2)

## 2019-11-28 MED ORDER — AMOXICILLIN-POT CLAVULANATE 875-125 MG PO TABS
1.0000 | ORAL_TABLET | Freq: Two times a day (BID) | ORAL | 0 refills | Status: DC
Start: 2019-11-28 — End: 2021-01-19

## 2019-11-28 NOTE — Discharge Summary (Signed)
Physician Discharge Summary  Patient ID: Hailey Schneider MRN: [id number] DOB/AGE: [birth year] 29 y.o.  Admit date: 11/27/2019 Discharge date: 11/28/2019  Admission Diagnoses:Postpartum Febrile episode, presumed endometritis  Discharge Diagnoses: Same Active Problems:   Postpartum fever   Discharged Condition: good  Hospital Course: Pt admitted with febrile episode and leukocytosis.  CT normal.  Began on abx and defervesced.  She remained clinically well without pain or fever and discharged home after 24h of IV abx.  Her WBC dropped from 23.2 to 10.8.  Consults: None  Significant Diagnostic Studies: WBC as above and normal CT  Treatments: IV hydration and antibiotics: gentamycin and clindamycin  Discharge Exam: Blood pressure 106/64, pulse 75, temperature 98.4 F (36.9 C), temperature source Oral, resp. rate 18, height 5\' 4"  (1.626 m), weight 60.8 kg, SpO2 99 %, currently breastfeeding. General appearance: alert, cooperative, appears stated age and no distress GI: soft, non-tender; bowel sounds normal; no masses,  no organomegaly  Disposition: Discharge disposition: 01-Home or Self Care       Discharge Instructions    Activity as tolerated   Complete by: As directed    Call MD for:   Complete by: As directed    Excessive vaginal bleeding or pain   Call MD for:  persistant nausea and vomiting   Complete by: As directed    Call MD for:  redness, tenderness, or signs of infection (pain, swelling, redness, odor or green/yellow discharge around incision site)   Complete by: As directed    Call MD for:  severe uncontrolled pain   Complete by: As directed    Call MD for:  temperature >100.4   Complete by: As directed    Diet general   Complete by: As directed    Discharge instructions   Complete by: As directed    Follow up in office in 6 weeks        Signed: Luz Lex 11/28/2019, 5:32 PM

## 2019-11-28 NOTE — Progress Notes (Signed)
Discharge instructions given to patient. Reviewed medication changes, antibiotic therapy and care. Patient verbalized understanding.

## 2019-11-28 NOTE — Progress Notes (Signed)
Post Partum Day 11 Subjective: no complaints, up ad lib, voiding and tolerating PO  Objective: Blood pressure (!) 102/59, pulse (!) 56, temperature 98.1 F (36.7 C), temperature source Oral, resp. rate 17, height 5\' 4"  (1.626 m), weight 60.8 kg, SpO2 100 %, currently breastfeeding.  Physical Exam:  General: alert, cooperative, appears stated age and no distress Lochia: appropriate Uterine Fundus: firm Incision: healing well DVT Evaluation: No evidence of DVT seen on physical exam.  Recent Labs    11/27/19 1359 11/28/19 0614  HGB 11.5* 9.6*  HCT 36.0 30.7*    Assessment/Plan: PP endometritis on Abx D1 Clinically doing well and afebrile and normal WBC Consider D/c on oral abx after 24 h iv abx   LOS: 1 day   Luz Lex 11/28/2019, 9:41 AM

## 2019-12-02 LAB — CULTURE, BLOOD (ROUTINE X 2)
Culture: NO GROWTH
Culture: NO GROWTH
Special Requests: ADEQUATE
Special Requests: ADEQUATE

## 2020-03-12 ENCOUNTER — Ambulatory Visit (INDEPENDENT_AMBULATORY_CARE_PROVIDER_SITE_OTHER): Payer: BC Managed Care – PPO | Admitting: Family Medicine

## 2020-03-12 ENCOUNTER — Other Ambulatory Visit: Payer: Self-pay

## 2020-03-12 ENCOUNTER — Encounter: Payer: Self-pay | Admitting: Family Medicine

## 2020-03-12 VITALS — BP 110/68 | HR 111 | Temp 97.6°F | Ht 64.0 in | Wt 117.0 lb

## 2020-03-12 DIAGNOSIS — D1721 Benign lipomatous neoplasm of skin and subcutaneous tissue of right arm: Secondary | ICD-10-CM | POA: Diagnosis not present

## 2020-03-12 DIAGNOSIS — Z0000 Encounter for general adult medical examination without abnormal findings: Secondary | ICD-10-CM

## 2020-03-12 DIAGNOSIS — S76912A Strain of unspecified muscles, fascia and tendons at thigh level, left thigh, initial encounter: Secondary | ICD-10-CM | POA: Diagnosis not present

## 2020-03-12 NOTE — Progress Notes (Signed)
Established Patient Office Visit  Subjective:  Patient ID: Hailey Schneider, female    DOB: [birth year]  Age: 29 y.o. MRN: [id number]  CC:  Chief Complaint  Patient presents with  . Annual Exam    CPE, patient would like referral to Dermatology for cyst on upper left arm     HPI Hailey Schneider presents for establishment of care, physical exam, evaluation of lipoma and discomfort in her left groin area.  Discomfort has been present since the pregnancy and delivery of her 39-month-old daughter.  It is slowly improving.  Diagnosed with a lipoma in her right upper arm 4 years ago.  It has not changed much.  She is considering excision.  She is otherwise healthy.  Is a mom.  She is breast-feeding.  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  She has had her Covid vaccine.  Past Medical History:  Diagnosis Date  . Frequent headaches   . PONV (postoperative nausea and vomiting)     Past Surgical History:  Procedure Laterality Date  . WISDOM TOOTH EXTRACTION      Family History  Problem Relation Age of Onset  . Cancer Mother        Breast  . Hypertension Maternal Grandmother   . Stroke Maternal Grandfather     Social History   Socioeconomic History  . Marital status: Married    Spouse name: Not on file  . Number of children: Not on file  . Years of education: Not on file  . Highest education level: Not on file  Occupational History  . Not on file  Tobacco Use  . Smoking status: Never Smoker  . Smokeless tobacco: Never Used  Vaping Use  . Vaping Use: Never used  Substance and Sexual Activity  . Alcohol use: Not Currently    Comment: Social  . Drug use: No  . Sexual activity: Yes  Other Topics Concern  . Not on file  Social History Narrative  . Not on file   Social Determinants of Health   Financial Resource Strain: Low Risk   . Difficulty of Paying Living Expenses: Not hard at all  Food Insecurity: No Food Insecurity  . Worried About  Charity fundraiser in the Last Year: Never true  . Ran Out of Food in the Last Year: Never true  Transportation Needs: No Transportation Needs  . Lack of Transportation (Medical): No  . Lack of Transportation (Non-Medical): No  Physical Activity:   . Days of Exercise per Week: Not on file  . Minutes of Exercise per Session: Not on file  Stress: No Stress Concern Present  . Feeling of Stress : Only a little  Social Connections: Unknown  . Frequency of Communication with Friends and Family: Not on file  . Frequency of Social Gatherings with Friends and Family: Not on file  . Attends Religious Services: Not on file  . Active Member of Clubs or Organizations: Not on file  . Attends Archivist Meetings: Not on file  . Marital Status: Married  Human resources officer Violence: Not At Risk  . Fear of Current or Ex-Partner: No  . Emotionally Abused: No  . Physically Abused: No  . Sexually Abused: No    Outpatient Medications Prior to Visit  Medication Sig Dispense Refill  . acetaminophen (TYLENOL) 325 MG tablet Take 2 tablets (650 mg total) by mouth every 6 (six) hours as needed (for pain scale < 4). 60 tablet 0  .  Prenatal MV-Min-Fe Fum-FA-DHA (PRENATAL 1 PO) One A Day Women's Prenatal DHA    . amoxicillin-clavulanate (AUGMENTIN) 875-125 MG tablet Take 1 tablet by mouth 2 (two) times daily. (Patient not taking: Reported on 03/12/2020) 14 tablet 0  . docusate sodium (COLACE) 100 MG capsule Take 100 mg by mouth daily as needed for mild constipation. (Patient not taking: Reported on 03/12/2020)    . ibuprofen (ADVIL) 600 MG tablet Take 1 tablet (600 mg total) by mouth every 6 (six) hours as needed. (Patient not taking: Reported on 03/12/2020) 60 tablet 0  . Prenatal Vit-Fe Fumarate-FA (PRENATAL MULTIVITAMIN) TABS tablet Take 1 tablet by mouth daily at 12 noon. (Patient not taking: Reported on 03/12/2020)     No facility-administered medications prior to visit.    No Known  Allergies  ROS Review of Systems  Constitutional: Negative.   HENT: Negative.   Eyes: Negative for photophobia and visual disturbance.  Respiratory: Negative.   Cardiovascular: Negative.   Gastrointestinal: Negative.  Negative for abdominal distention, abdominal pain, nausea and vomiting.  Endocrine: Negative for polyphagia and polyuria.  Genitourinary: Negative.   Musculoskeletal: Negative for arthralgias, back pain and gait problem.  Skin: Negative for pallor and rash.  Hematological: Negative.   Psychiatric/Behavioral: Negative.        Depression screen [facility] 2/9 03/12/2020 03/12/2020  Decreased Interest 0 0  Down, Depressed, Hopeless 0 0  PHQ - 2 Score 0 0    Objective:    Physical Exam Vitals and nursing note reviewed.  Constitutional:      General: She is not in acute distress.    Appearance: Normal appearance. She is normal weight. She is not ill-appearing, toxic-appearing or diaphoretic.  HENT:     Head: Normocephalic and atraumatic.     Right Ear: Tympanic membrane, ear canal and external ear normal.     Left Ear: Tympanic membrane, ear canal and external ear normal.     Nose: Nose normal.     Mouth/Throat:     Mouth: Mucous membranes are moist.     Pharynx: Oropharynx is clear. No oropharyngeal exudate or posterior oropharyngeal erythema.  Eyes:     General: No scleral icterus.       Right eye: No discharge.        Left eye: No discharge.     Extraocular Movements: Extraocular movements intact.     Conjunctiva/sclera: Conjunctivae normal.     Pupils: Pupils are equal, round, and reactive to light.  Cardiovascular:     Rate and Rhythm: Normal rate and regular rhythm.  Pulmonary:     Effort: Pulmonary effort is normal.     Breath sounds: Normal breath sounds.  Abdominal:     General: Abdomen is flat. Bowel sounds are normal. There is no distension.     Palpations: Abdomen is soft. There is no mass.     Tenderness: There is no abdominal tenderness. There is  no guarding or rebound.     Hernia: No hernia is present.  Musculoskeletal:     Cervical back: No rigidity or tenderness.     Right hip: Normal. No tenderness or bony tenderness. Normal range of motion.     Left hip: Normal. No tenderness or bony tenderness. Normal range of motion.     Right lower leg: Normal. No edema.     Left lower leg: Normal. No edema.       Legs:  Lymphadenopathy:     Cervical: No cervical adenopathy.  Skin:  General: Skin is warm and dry.  Neurological:     Mental Status: She is alert and oriented to person, place, and time.  Psychiatric:        Mood and Affect: Mood normal.        Behavior: Behavior normal.     BP 110/68   Pulse (!) 111   Temp 97.6 F (36.4 C) (Tympanic)   Ht 5\' 4"  (1.626 m)   Wt 117 lb (53.1 kg)   SpO2 96%   BMI 20.08 kg/m  Wt Readings from Last 3 Encounters:  03/12/20 117 lb (53.1 kg)  11/27/19 134 lb (60.8 kg)  11/17/19 160 lb 1.6 oz (72.6 kg)     Health Maintenance Due  Topic Date Due  . Hepatitis C Screening  Never done  . INFLUENZA VACCINE  02/22/2020    There are no preventive care reminders to display for this patient.  No results found for: TSH Lab Results  Component Value Date   WBC 10.8 (H) 11/28/2019   HGB 9.6 (L) 11/28/2019   HCT 30.7 (L) 11/28/2019   MCV 90.8 11/28/2019   PLT 359 11/28/2019   Lab Results  Component Value Date   NA 137 11/27/2019   K 3.8 11/27/2019   CO2 24 11/27/2019   GLUCOSE 120 (H) 11/27/2019   BUN 10 11/27/2019   CREATININE 0.87 11/27/2019   BILITOT 0.7 11/27/2019   ALKPHOS 113 11/27/2019   AST 16 11/27/2019   ALT 23 11/27/2019   PROT 6.6 11/27/2019   ALBUMIN 3.0 (L) 11/27/2019   CALCIUM 8.6 (L) 11/27/2019   ANIONGAP 11 11/27/2019   GFR 96.83 09/11/2018   Lab Results  Component Value Date   CHOL 165 09/11/2018   Lab Results  Component Value Date   HDL 73.90 09/11/2018   Lab Results  Component Value Date   LDLCALC 84 09/11/2018   Lab Results  Component  Value Date   TRIG 39.0 09/11/2018   Lab Results  Component Value Date   CHOLHDL 2 09/11/2018   No results found for: HGBA1C    Assessment & Plan:   Problem List Items Addressed This Visit      Musculoskeletal and Integument   Strain of iliopsoas muscle, left, initial encounter     Other   Lipoma of right upper extremity   Well adult exam - Primary   Relevant Orders   CBC   Comprehensive metabolic panel   Lipid panel   Urinalysis, Routine w reflex microscopic      No orders of the defined types were placed in this encounter.   Follow-up: Return in about 1 year (around 03/12/2021), or Return fasting for blood work..   Given information on health maintenance and disease prevention as well as lipomas.  She will return fasting for labs.  Will ask for general surgery referral if there is further concern about her lipoma.  We discussed the iliopsoas strain.  She will give it more time to heal.  Will call and ask for sports medicine referral as needed if it does not continue to improve. Libby Maw, MD

## 2020-03-12 NOTE — Patient Instructions (Signed)
Lipoma  A lipoma is a noncancerous (benign) tumor that is made up of fat cells. This is a very common type of soft-tissue growth. Lipomas are usually found under the skin (subcutaneous). They may occur in any tissue of the body that contains fat. Common areas for lipomas to appear include the back, arms, shoulders, buttocks, and thighs. Lipomas grow slowly, and they are usually painless. Most lipomas do not cause problems and do not require treatment. What are the causes? The cause of this condition is not known. What increases the risk? You are more likely to develop this condition if:  You are 27-56 years old.  You have a family history of lipomas. What are the signs or symptoms? A lipoma usually appears as a small, round bump under the skin. In most cases, the lump will:  Feel soft or rubbery.  Not cause pain or other symptoms. However, if a lipoma is located in an area where it pushes on nerves, it can become painful or cause other symptoms. How is this diagnosed? A lipoma can usually be diagnosed with a physical exam. You may also have tests to confirm the diagnosis and to rule out other conditions. Tests may include:  Imaging tests, such as a CT scan or an MRI.  Removal of a tissue sample to be looked at under a microscope (biopsy). How is this treated? Treatment for this condition depends on the size of the lipoma and whether it is causing any symptoms.  For small lipomas that are not causing problems, no treatment is needed.  If a lipoma is bigger or it causes problems, surgery may be done to remove the lipoma. Lipomas can also be removed to improve appearance. Most often, the procedure is done after applying a medicine that numbs the area (local anesthetic).  Liposuction may be done to reduce the size of the lipoma before it is removed through surgery, or it may be done to remove the lipoma. Lipomas are removed with this method in order to limit incision size and scarring. A  liposuction tube is inserted through a small incision into the lipoma, and the contents of the lipoma are removed through the tube with suction. Follow these instructions at home:  Watch your lipoma for any changes.  Keep all follow-up visits as told by your health care provider. This is important. Contact a health care provider if:  Your lipoma becomes larger or hard.  Your lipoma becomes painful, red, or increasingly swollen. These could be signs of infection or a more serious condition. Get help right away if:  You develop tingling or numbness in an area near the lipoma. This could indicate that the lipoma is causing nerve damage. Summary  A lipoma is a noncancerous tumor that is made up of fat cells.  Most lipomas do not cause problems and do not require treatment.  If a lipoma is bigger or it causes problems, surgery may be done to remove the lipoma.  Contact a health care provider if your lipoma becomes larger or hard, or if it becomes painful, red, or increasingly swollen. Pain, redness, and swelling could be signs of infection or a more serious condition. This information is not intended to replace advice given to you by your health care provider. Make sure you discuss any questions you have with your health care provider. Document Revised: 02/24/2019 Document Reviewed: 02/24/2019 Elsevier Patient Education  [place] 72-27 Years Old, Female Preventive care refers to visits with your  health care provider and lifestyle choices that can promote health and wellness. This includes:  A yearly physical exam. This may also be called an annual well check.  Regular dental visits and eye exams.  Immunizations.  Screening for certain conditions.  Healthy lifestyle choices, such as eating a healthy diet, getting regular exercise, not using drugs or products that contain nicotine and tobacco, and limiting alcohol use. What can I expect for my preventive  care visit? Physical exam Your health care provider will check your:  Height and weight. This may be used to calculate body mass index (BMI), which tells if you are at a healthy weight.  Heart rate and blood pressure.  Skin for abnormal spots. Counseling Your health care provider may ask you questions about your:  Alcohol, tobacco, and drug use.  Emotional well-being.  Home and relationship well-being.  Sexual activity.  Eating habits.  Work and work Statistician.  Method of birth control.  Menstrual cycle.  Pregnancy history. What immunizations do I need?  Influenza (flu) vaccine  This is recommended every year. Tetanus, diphtheria, and pertussis (Tdap) vaccine  You may need a Td booster every 10 years. Varicella (chickenpox) vaccine  You may need this if you have not been vaccinated. Human papillomavirus (HPV) vaccine  If recommended by your health care provider, you may need three doses over 6 months. Measles, mumps, and rubella (MMR) vaccine  You may need at least one dose of MMR. You may also need a second dose. Meningococcal conjugate (MenACWY) vaccine  One dose is recommended if you are age 60-21 years and a first-year college student living in a residence hall, or if you have one of several medical conditions. You may also need additional booster doses. Pneumococcal conjugate (PCV13) vaccine  You may need this if you have certain conditions and were not previously vaccinated. Pneumococcal polysaccharide (PPSV23) vaccine  You may need one or two doses if you smoke cigarettes or if you have certain conditions. Hepatitis A vaccine  You may need this if you have certain conditions or if you travel or work in places where you may be exposed to hepatitis A. Hepatitis B vaccine  You may need this if you have certain conditions or if you travel or work in places where you may be exposed to hepatitis B. Haemophilus influenzae type b (Hib) vaccine  You may  need this if you have certain conditions. You may receive vaccines as individual doses or as more than one vaccine together in one shot (combination vaccines). Talk with your health care provider about the risks and benefits of combination vaccines. What tests do I need?  Blood tests  Lipid and cholesterol levels. These may be checked every 5 years starting at age 76.  Hepatitis C test.  Hepatitis B test. Screening  Diabetes screening. This is done by checking your blood sugar (glucose) after you have not eaten for a while (fasting).  Sexually transmitted disease (STD) testing.  BRCA-related cancer screening. This may be done if you have a family history of breast, ovarian, tubal, or peritoneal cancers.  Pelvic exam and Pap test. This may be done every 3 years starting at age 88. Starting at age 50, this may be done every 5 years if you have a Pap test in combination with an HPV test. Talk with your health care provider about your test results, treatment options, and if necessary, the need for more tests. Follow these instructions at home: Eating and drinking   Eat a  diet that includes fresh fruits and vegetables, whole grains, lean protein, and low-fat dairy.  Take vitamin and mineral supplements as recommended by your health care provider.  Do not drink alcohol if: ? Your health care provider tells you not to drink. ? You are pregnant, may be pregnant, or are planning to become pregnant.  If you drink alcohol: ? Limit how much you have to 0-1 drink a day. ? Be aware of how much alcohol is in your drink. In the [place] one drink equals one 12 oz bottle of beer (355 mL), one 5 oz glass of wine (148 mL), or one 1 oz glass of hard liquor (44 mL). Lifestyle  Take daily care of your teeth and gums.  Stay active. Exercise for at least 30 minutes on 5 or more days each week.  Do not use any products that contain nicotine or tobacco, such as cigarettes, e-cigarettes, and chewing  tobacco. If you need help quitting, ask your health care provider.  If you are sexually active, practice safe sex. Use a condom or other form of birth control (contraception) in order to prevent pregnancy and STIs (sexually transmitted infections). If you plan to become pregnant, see your health care provider for a preconception visit. What's next?  Visit your health care provider once a year for a well check visit.  Ask your health care provider how often you should have your eyes and teeth checked.  Stay up to date on all vaccines. This information is not intended to replace advice given to you by your health care provider. Make sure you discuss any questions you have with your health care provider. Document Revised: 03/21/2018 Document Reviewed: 03/21/2018 Elsevier Patient Education  Hailey Schneider, Female Adopting a healthy lifestyle and getting preventive care are important in promoting health and wellness. Ask your health care provider about:  The right schedule for you to have regular tests and exams.  Things you can do on your own to prevent diseases and keep yourself healthy. What should I know about diet, weight, and exercise? Eat a healthy diet   Eat a diet that includes plenty of vegetables, fruits, low-fat dairy products, and lean protein.  Do not eat a lot of foods that are high in solid fats, added sugars, or sodium. Maintain a healthy weight Body mass index (BMI) is used to identify weight problems. It estimates body fat based on height and weight. Your health care provider can help determine your BMI and help you achieve or maintain a healthy weight. Get regular exercise Get regular exercise. This is one of the most important things you can do for your health. Most adults should:  Exercise for at least 150 minutes each week. The exercise should increase your heart rate and make you sweat (moderate-intensity exercise).  Do strengthening  exercises at least twice a week. This is in addition to the moderate-intensity exercise.  Spend less time sitting. Even light physical activity can be beneficial. Watch cholesterol and blood lipids Have your blood tested for lipids and cholesterol at 27 years of age, then have this test every 5 years. Have your cholesterol levels checked more often if:  Your lipid or cholesterol levels are high.  You are older than 27 years of age.  You are at high risk for heart disease. What should I know about cancer screening? Depending on your health history and family history, you may need to have cancer screening at various ages. This may include screening  for:  Breast cancer.  Cervical cancer.  Colorectal cancer.  Skin cancer.  Lung cancer. What should I know about heart disease, diabetes, and high blood pressure? Blood pressure and heart disease  High blood pressure causes heart disease and increases the risk of stroke. This is more likely to develop in people who have high blood pressure readings, are of African descent, or are overweight.  Have your blood pressure checked: ? Every 3-5 years if you are 59-34 years of age. ? Every year if you are 40 years old or older. Diabetes Have regular diabetes screenings. This checks your fasting blood sugar level. Have the screening done:  Once every three years after age 38 if you are at a normal weight and have a low risk for diabetes.  More often and at a younger age if you are overweight or have a high risk for diabetes. What should I know about preventing infection? Hepatitis B If you have a higher risk for hepatitis B, you should be screened for this virus. Talk with your health care provider to find out if you are at risk for hepatitis B infection. Hepatitis C Testing is recommended for:  Everyone born from [birth year] through 1963.  Anyone with known risk factors for hepatitis C. Sexually transmitted infections (STIs)  Get screened  for STIs, including gonorrhea and chlamydia, if: ? You are sexually active and are younger than 27 years of age. ? You are older than 27 years of age and your health care provider tells you that you are at risk for this type of infection. ? Your sexual activity has changed since you were last screened, and you are at increased risk for chlamydia or gonorrhea. Ask your health care provider if you are at risk.  Ask your health care provider about whether you are at high risk for HIV. Your health care provider may recommend a prescription medicine to help prevent HIV infection. If you choose to take medicine to prevent HIV, you should first get tested for HIV. You should then be tested every 3 months for as long as you are taking the medicine. Pregnancy  If you are about to stop having your period (premenopausal) and you may become pregnant, seek counseling before you get pregnant.  Take 400 to 800 micrograms (mcg) of folic acid every day if you become pregnant.  Ask for birth control (contraception) if you want to prevent pregnancy. Osteoporosis and menopause Osteoporosis is a disease in which the bones lose minerals and strength with aging. This can result in bone fractures. If you are 73 years old or older, or if you are at risk for osteoporosis and fractures, ask your health care provider if you should:  Be screened for bone loss.  Take a calcium or vitamin D supplement to lower your risk of fractures.  Be given hormone replacement therapy (HRT) to treat symptoms of menopause. Follow these instructions at home: Lifestyle  Do not use any products that contain nicotine or tobacco, such as cigarettes, e-cigarettes, and chewing tobacco. If you need help quitting, ask your health care provider.  Do not use street drugs.  Do not share needles.  Ask your health care provider for help if you need support or information about quitting drugs. Alcohol use  Do not drink alcohol if: ? Your  health care provider tells you not to drink. ? You are pregnant, may be pregnant, or are planning to become pregnant.  If you drink alcohol: ? Limit how much you  use to 0-1 drink a day. ? Limit intake if you are breastfeeding.  Be aware of how much alcohol is in your drink. In the [place] one drink equals one 12 oz bottle of beer (355 mL), one 5 oz glass of wine (148 mL), or one 1 oz glass of hard liquor (44 mL). General instructions  Schedule regular health, dental, and eye exams.  Stay current with your vaccines.  Tell your health care provider if: ? You often feel depressed. ? You have ever been abused or do not feel safe at home. Summary  Adopting a healthy lifestyle and getting preventive care are important in promoting health and wellness.  Follow your health care provider's instructions about healthy diet, exercising, and getting tested or screened for diseases.  Follow your health care provider's instructions on monitoring your cholesterol and blood pressure. This information is not intended to replace advice given to you by your health care provider. Make sure you discuss any questions you have with your health care provider. Document Revised: 07/03/2018 Document Reviewed: 07/03/2018 Elsevier Patient Education  2020 [employer].

## 2021-01-18 ENCOUNTER — Other Ambulatory Visit: Payer: Self-pay

## 2021-01-19 ENCOUNTER — Encounter: Payer: Self-pay | Admitting: Family Medicine

## 2021-01-19 ENCOUNTER — Ambulatory Visit: Payer: BC Managed Care – PPO | Admitting: Family Medicine

## 2021-01-19 VITALS — BP 116/72 | HR 72 | Temp 97.2°F | Ht 64.0 in | Wt 120.8 lb

## 2021-01-19 DIAGNOSIS — R519 Headache, unspecified: Secondary | ICD-10-CM

## 2021-01-19 DIAGNOSIS — J029 Acute pharyngitis, unspecified: Secondary | ICD-10-CM | POA: Diagnosis not present

## 2021-01-19 LAB — POCT RAPID STREP A (OFFICE): Rapid Strep A Screen: NEGATIVE

## 2021-01-19 NOTE — Patient Instructions (Signed)
Pharyngitis  Pharyngitis is redness, pain, and swelling (inflammation) of the throat (pharynx). It is a very common cause of sore throat. Pharyngitis can be caused by a bacteria, but it is usually caused by a virus. Most cases of pharyngitis getbetter on their own without treatment. What are the causes? This condition may be caused by: Infection by viruses (viral). Viral pharyngitis spreads from person to person (is contagious) through coughing, sneezing, and sharing of personal items or utensils such as cups, forks, spoons, and toothbrushes. Infection by bacteria (bacterial). Bacterial pharyngitis may be spread by touching the nose or face after coming in contact with the bacteria, or through more intimate contact, such as kissing. Allergies. Allergies can cause buildup of mucus in the throat (post-nasal drip), leading to inflammation and irritation. Allergies can also cause blocked nasal passages, forcing breathing through the mouth, which dries and irritates the throat. What increases the risk? You are more likely to develop this condition if: You are 40-66 years old. You are exposed to crowded environments such as daycare, school, or dormitory living. You live in a cold climate. You have a weakened disease-fighting (immune) system. What are the signs or symptoms? Symptoms of this condition vary by the cause (viral, bacterial, or allergies) and can include: Sore throat. Fatigue. Low-grade fever. Headache. Joint pain and muscle aches. Skin rashes. Swollen glands in the throat (lymph nodes). Plaque-like film on the throat or tonsils. This is often a symptom of bacterial pharyngitis. Vomiting. Stuffy nose (nasal congestion). Cough. Red, itchy eyes (conjunctivitis). Loss of appetite. How is this diagnosed? This condition is often diagnosed based on your medical history and a physical exam. Your health care provider will ask you questions about your illness and your symptoms. A swab of  your throat may be done to check for bacteria (rapid strep test). Other lab tests may also be done, depending on the suspected cause, butthese are rare. How is this treated? This condition usually gets better in 3-4 days without medicine. Bacterialpharyngitis may be treated with antibiotic medicines. Follow these instructions at home: Take over-the-counter and prescription medicines only as told by your health care provider. If you were prescribed an antibiotic medicine, take it as told by your health care provider. Do not stop taking the antibiotic even if you start to feel better. Do not give children aspirin because of the association with Reye syndrome. Drink enough water and fluids to keep your urine clear or pale yellow. Get a lot of rest. Gargle with a salt-water mixture 3-4 times a day or as needed. To make a salt-water mixture, completely dissolve -1 tsp of salt in 1 cup of warm water. Do not swallow this mixture. If your health care provider approves, you may use throat lozenges or sprays to soothe your throat. Contact a health care provider if: You have large, tender lumps in your neck. You have a rash. You cough up green, yellow-brown, or bloody spit. Get help right away if: Your neck becomes stiff. You drool or are unable to swallow liquids. You cannot drink or take medicines without vomiting. You have severe pain that does not go away, even after you take medicine. You have trouble breathing, and it is not caused by a stuffy nose. You have new pain and swelling in your joints such as the knees, ankles, wrists, or elbows. Summary Pharyngitis is redness, pain, and swelling (inflammation) of the throat (pharynx). While pharyngitis can be caused by a bacteria, the most common causes are viral. Most cases  of pharyngitis get better on their own without treatment. Bacterial pharyngitis is treated with antibiotic medicines. This information is not intended to replace advice given  to you by your health care provider. Make sure you discuss any questions you have with your healthcare provider. Document Revised: 06/10/2020 Document Reviewed: 06/10/2020 Elsevier Patient Education  2022 [employer].

## 2021-01-19 NOTE — Progress Notes (Signed)
[facility] [address] [place] [address] Dept: [phone] Dept Fax: [phone]  Office Visit  Subjective:    Patient ID: Hailey Schneider, female    DOB: September 15, 1990, 30 y.o..   MRN: [id number]  Chief Complaint  Patient presents with   Acute Visit    C/o having RT ear pain x 1 day.  Also having migraine off/on x 3 days.  Taken Tylenol w/ some relief.     History of Present Illness:  Patient is in today for a complaint of right ear pain starting yesterday. She is not sure if it is associated, but she noted on Saturday night she awakened with an odd sensation, as if water running or sloshing in her head. She then developed a headache. She termed this a migraine. The headaches have been episodic since then. These are both occipital and frontal. She notes the headaches are associated with some phonophobia and photophobia. She has not had significant nausea with these. She had a brief period of headaches some years ago soon after the death of a relative. Ms. Gathright also does note some sore throat pain.  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 60 tablet 0   Prenatal MV-Min-Fe Fum-FA-DHA (PRENATAL 1 PO) One A Day Women's Prenatal DHA     ibuprofen (ADVIL) 600 MG tablet Take 1 tablet (600 mg total) by mouth every 6 (six) hours as needed. 60 tablet 0    amoxicillin-clavulanate (AUGMENTIN) 875-125 MG tablet Take 1 tablet by mouth 2 (two) times daily. (Patient not taking: No sig reported) 14 tablet 0   docusate sodium (COLACE) 100 MG capsule Take 100 mg by mouth daily as needed for mild constipation. (Patient not taking: No sig reported)     Prenatal Vit-Fe Fumarate-FA (PRENATAL MULTIVITAMIN) TABS tablet Take 1 tablet by mouth daily at 12 noon. (Patient not taking: No sig reported)     No facility-administered medications prior to visit.   No Known Allergies    Objective:   Today's Vitals   01/19/21 0905  BP: 116/72  Pulse: 72  Temp: (!) 97.2 F (36.2 C)  TempSrc: Temporal  SpO2: 99%  Weight: 120 lb 12.8 oz (54.8 kg)  Height: '5\' 4"'  (1.626 m)   Body mass index is 20.74 kg/m.   General: Well developed, well nourished. No acute distress. HEENT: Normocephalic, non-traumatic. PERRL, EOMI. Conjunctiva clear. Fundiscopic exam shows normal    disc and vasculature. External ears normal. Right EAC is obscured with wax. The left EAC and TM is normal.   Nose clear without congestion or rhinorrhea. Mucous membranes moist. There are white exudates on the    right tonsil and a small right patch on the pretonsillar pillar. Oropharynx clear. Good dentition. Neck: Supple. No lymphadenopathy. No thyromegaly. Psych: Alert and oriented. Normal mood and affect.  Health Maintenance  Due  Topic Date Due   Hepatitis C Screening  Never done   COVID-19 Vaccine (3 - Booster for Pfizer series) 10/19/2020   Lab Results Rapid Strep- Neg.    Assessment & Plan:  1. Pharyngitis, unspecified etiology I suspect a viral pharyngitis in light of the neg. Strep test. We discussed home care, including salt water gargles to soothe her throat. If she develops fever or a worsening sore throat over the next week, she should return for reassessment. I recommend she use an ear wax removal kit to relive the impacted wax in the right EAC.  - POCT rapid strep A  2. Acute  nonintractable headache, unspecified headache type Ms. Jasperson's headaches have some features of migraines, but have not existed long enough to establish a migraine pattern. I recommend she use ibuprofen to see if this will relive her headaches and also help with her other pharyngitis symptoms.   Haydee Salter, MD

## 2021-06-23 IMAGING — US US MFM OB LIMITED
1 series · 14 of 28 positions shown · non-contrast
Comparison: none

[Series 1: us mfm ob limited · 14 of 66 slices shown]
[im 3/66]
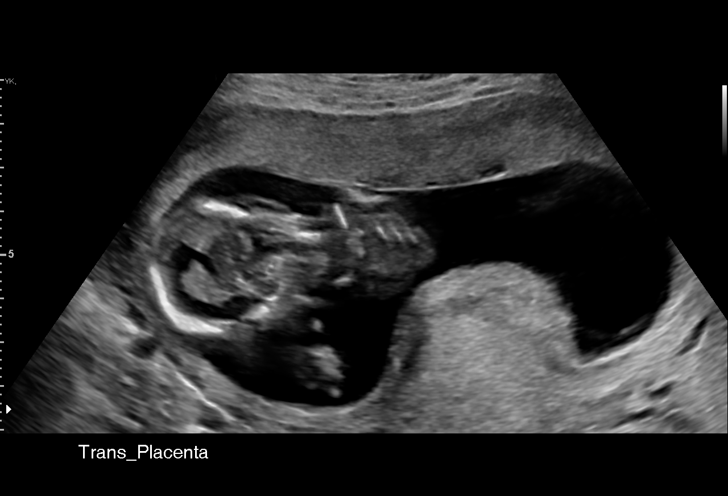
[im 8/66]
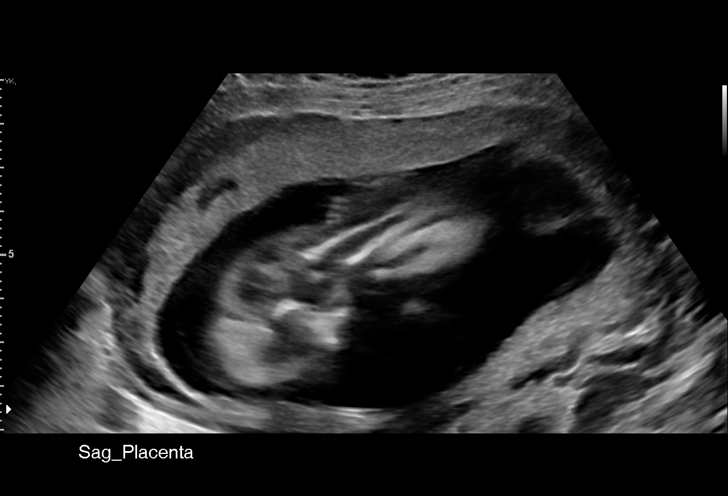
[im 13/66]
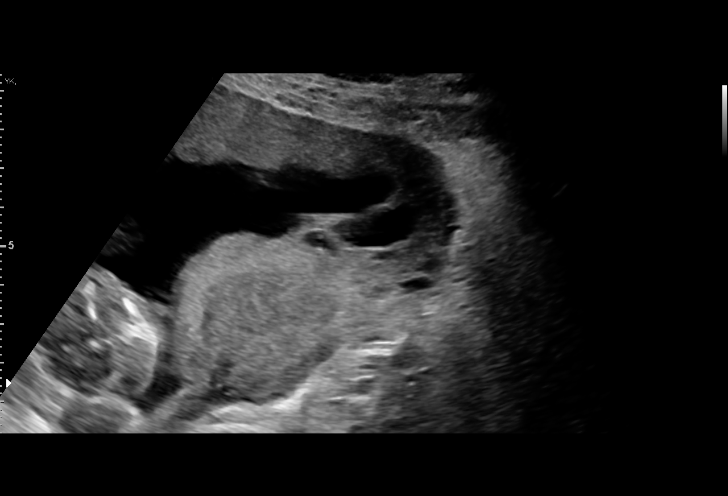
[im 17/66]
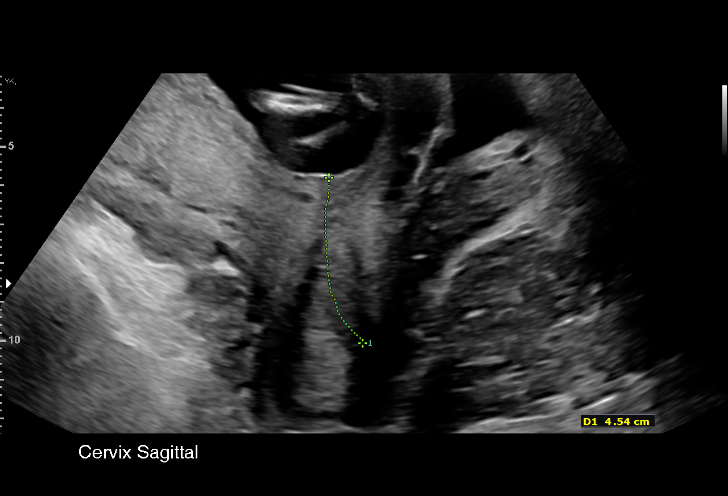
[im 22/66]
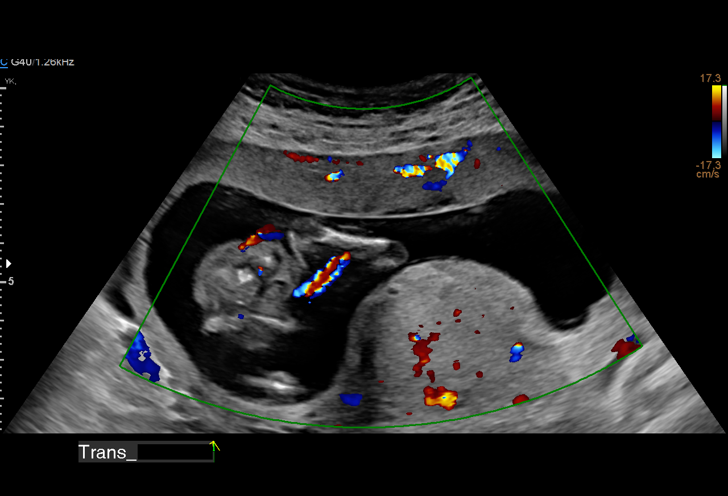
[im 27/66]
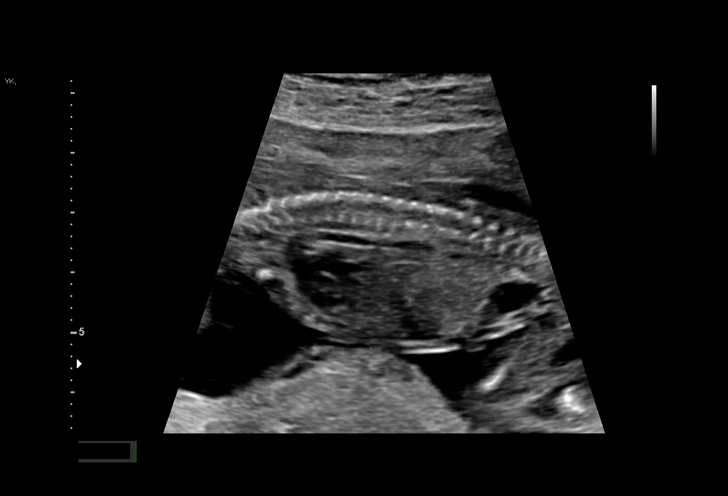
[im 32/66]
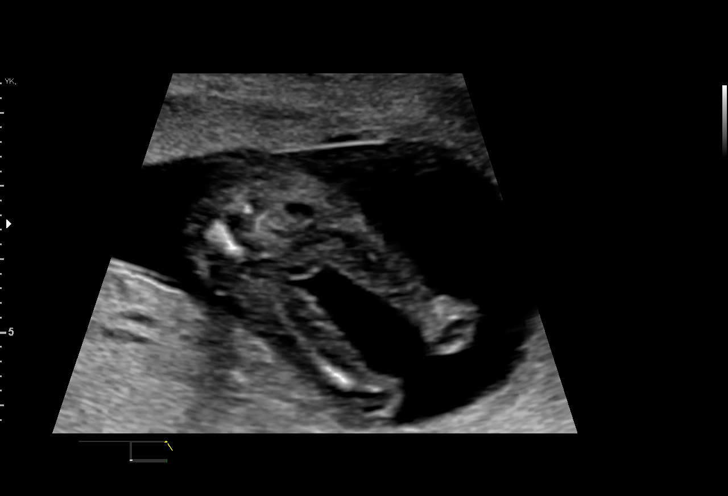
[im 37/66]
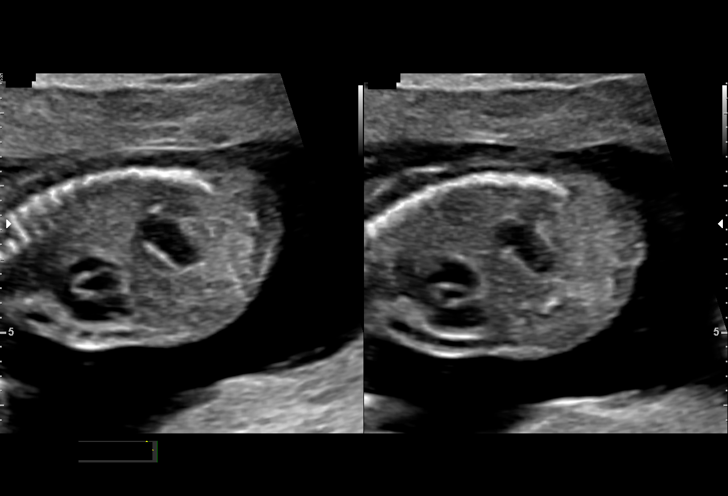
[im 41/66]
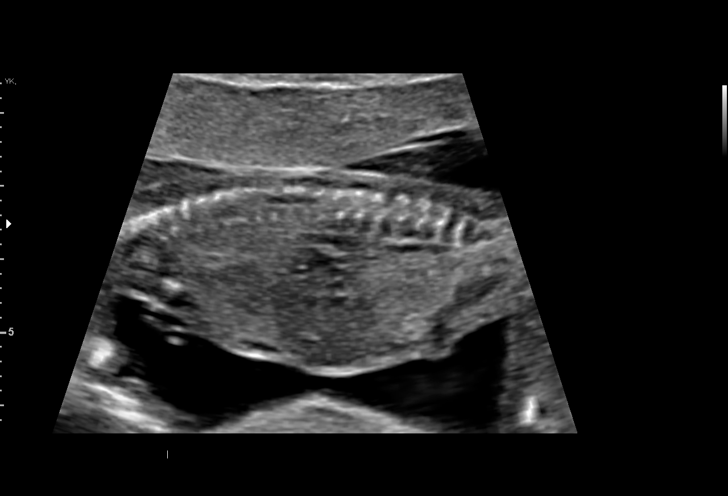
[im 46/66]
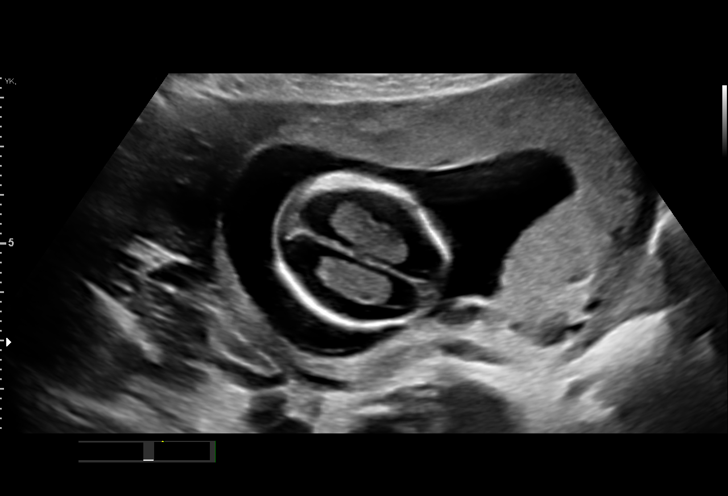
[im 51/66]
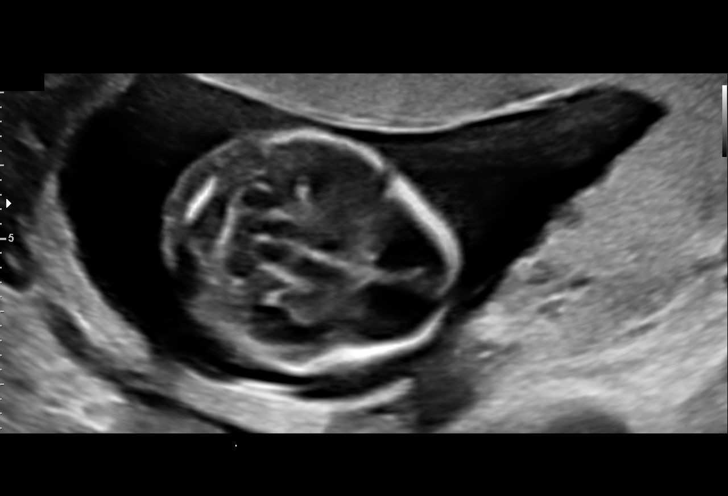
[im 56/66]
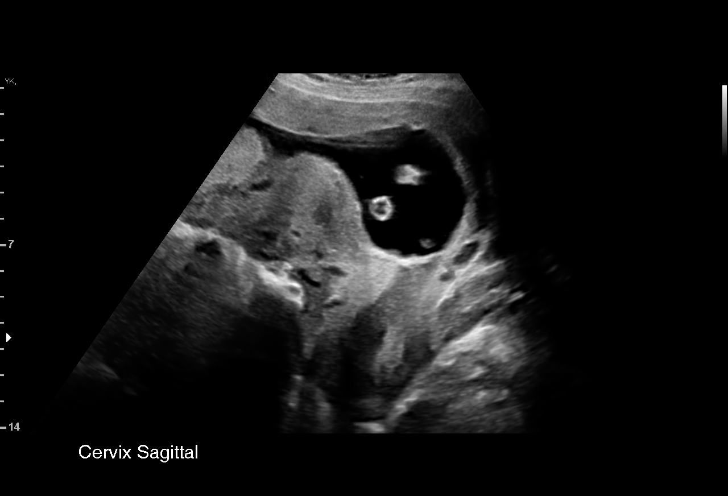
[im 61/66]
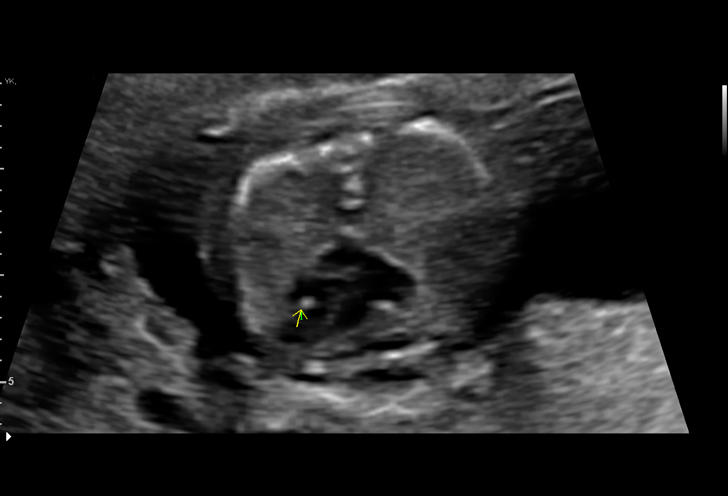
[im 66/66]
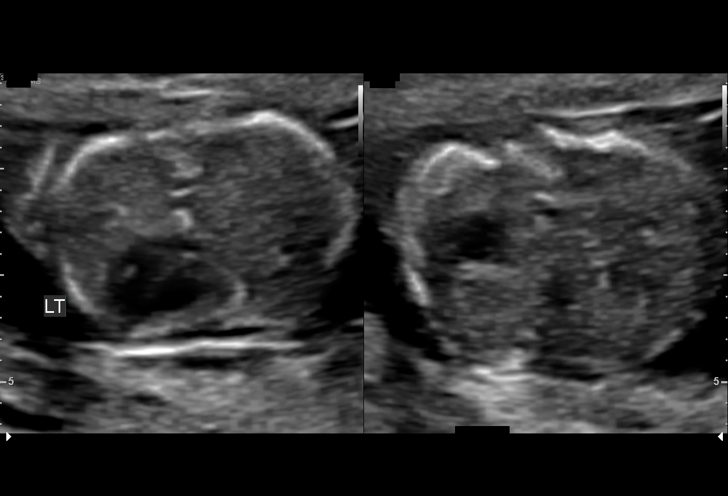

[14 of 28 positions shown; findings below may reference images not displayed]

HARSHIL CNM
                   MAU/Triage                                [HOSPITAL]

  1  US MFM OB LIMITED                    76815.01     PHETOLE ZUMA
 ----------------------------------------------------------------------

 ----------------------------------------------------------------------
Indications

  Vaginal bleeding in pregnancy, second
  trimester
  15 weeks gestation of pregnancy
 ----------------------------------------------------------------------
Fetal Evaluation

 Num Of Fetuses:          1
 Fetal Heart Rate(bpm):   161
 Cardiac Activity:        Observed
 Presentation:            Variable
 Placenta:                Anterior
 P. Cord Insertion:       Visualized, central

 Amniotic Fluid
 AFI FV:      Within normal limits

                             Largest Pocket(cm)


 Comment:    No placental abruption or previa identified.
OB History

 Gravidity:    1
Gestational Age

 LMP:           15w 1d        Date:  02/14/19                 EDD:   11/21/19
 Best:          15w 1d     Det. By:  LMP  (02/14/19)          EDD:   11/21/19
Anatomy

 Cranium:               Appears normal         Ductal Arch:            Appears normal
 Cavum:                 Appears normal         Diaphragm:              Appears normal
 Ventricles:            Appears normal         Stomach:                Appears normal, left
                                                                       sided
 Choroid Plexus:        Appears normal         Kidneys:                Appear normal
 Cerebellum:            Appears normal         Bladder:                Appears normal
 Posterior Fossa:       Appears normal         Upper Extremities:      Visualized
 RVOT:                  Appears normal         Lower Extremities:      Visualized
Cervix Uterus Adnexa

 Cervix
 Length:           4.54  cm.
 Normal appearance by transabdominal scan.

 Uterus
 No abnormality visualized.

 Left Ovary
 Within normal limits.

 Right Ovary
 Within normal limits.

 Cul De Sac
 No free fluid seen.

 Adnexa
 No abnormality visualized.
Impression

 Patient was evaluated for c/o vaginal bleeding.
 A limited ultrasound study was performed. Amniotic fluid is
 normal and good fetal activity is seen. Placenta appears
 normal. On transabdominal scan, the cervix looks long and
 closed.
                 Tarp, Povl Erik

## 2021-08-18 ENCOUNTER — Encounter: Payer: Self-pay | Admitting: Physician Assistant

## 2021-08-18 ENCOUNTER — Telehealth: Payer: BC Managed Care – PPO | Admitting: Physician Assistant

## 2021-08-18 DIAGNOSIS — J069 Acute upper respiratory infection, unspecified: Secondary | ICD-10-CM

## 2021-08-18 DIAGNOSIS — H6690 Otitis media, unspecified, unspecified ear: Secondary | ICD-10-CM

## 2021-08-18 NOTE — Progress Notes (Signed)
Virtual Visit Consent   Hailey Schneider, you are scheduled for a virtual visit with a  provider today.     Just as with appointments in the office, your consent must be obtained to participate.  Your consent will be active for this visit and any virtual visit you may have with one of our providers in the next 365 days.     If you have a MyChart account, a copy of this consent can be sent to you electronically.  All virtual visits are billed to your insurance company just like a traditional visit in the office.    As this is a virtual visit, video technology does not allow for your provider to perform a traditional examination.  This may limit your provider's ability to fully assess your condition.  If your provider identifies any concerns that need to be evaluated in person or the need to arrange testing (such as labs, EKG, etc.), we will make arrangements to do so.     Although advances in technology are sophisticated, we cannot ensure that it will always work on either your end or our end.  If the connection with a video visit is poor, the visit may have to be switched to a telephone visit.  With either a video or telephone visit, we are not always able to ensure that we have a secure connection.     I need to obtain your verbal consent now.   Are you willing to proceed with your visit today?    [name] has provided verbal consent on 08/18/2021 for a virtual visit (video or telephone).   [name], [place]   Date: 08/18/2021 2:55 PM   Virtual Visit via Video Note   I, [name], connected with  Halei Hanover  ([id number], 07/13/1991) on 08/18/21 at  2:30 PM EST by a video-enabled telemedicine application and verified that I am speaking with the correct person using two identifiers.  Location: Patient: Virtual Visit Location Patient: Home Provider: Virtual Visit Location Provider: Home Office   I discussed the limitations of evaluation and management  by telemedicine and the availability of in person appointments. The patient expressed understanding and agreed to proceed.    History of Present Illness: Hailey Schneider is a 31 y.o. who identifies as a female who was assigned female at birth, and is being seen today for nasal and sinus congestion with hoarseness, headache and low-grade fever starting last week. Notes that a lot of symptoms have improved with fever breaking as of this morning. Still with some mild congestion and now some R ear pressure and discomfort. Denies tinnitus or dizziness. Denies chest congestion or SOB. Has not tested for COVID. Is currently ~ [redacted] weeks pregnant.  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  Resting comfortably at home.  Head is normocephalic, atraumatic.  No labored breathing. Speech is clear and coherent with logical content.  Patient is alert and oriented at baseline.  Assessment and Plan: 1. Viral URI with cough  Symptoms overall improving with resolution of fever. Some mild ear pressure and popping which is most likely consistent with mild eustachian tube dysfunction from viral illness. Cannot definitively rule out the start of AOM. For now, reviewed supportive measures and OTC medications safe for pregnancy. She is to let us know if there is any recurrence of fever or initial symptoms, or if the ear pressure is not improving with recommendations given, as we may want to  start a pregnancy-safe antibiotic at that time.   Follow Up Instructions: I discussed the assessment and treatment plan with the patient. The patient was provided an opportunity to ask questions and all were answered. The patient agreed with the plan and demonstrated an understanding of the instructions.  A copy of instructions were sent to the patient via MyChart unless otherwise noted below.   The patient was advised to call back or seek an in-person evaluation if the symptoms worsen or if the condition fails to improve as anticipated.  Time:  I spent 15 minutes with the patient via telehealth technology discussing the above problems/concerns.    [name], PA-C

## 2021-08-18 NOTE — Patient Instructions (Signed)
°  Hailey Schneider, thank you for joining [name], PA-C for today's virtual visit.  While this provider is not your primary care provider (PCP), if your PCP is located in our provider database this encounter information will be shared with them immediately following your visit.  Consent: (Patient) Hailey Schneider provided verbal consent for this virtual visit at the beginning of the encounter.  Current Medications:  Current Outpatient Medications:    acetaminophen (TYLENOL) 325 MG tablet, Take 2 tablets (650 mg total) by mouth every 6 (six) hours as needed (for pain scale < 4)., Disp: 60 tablet, Rfl: 0   ibuprofen (ADVIL) 600 MG tablet, Take 1 tablet (600 mg total) by mouth every 6 (six) hours as needed., Disp: 60 tablet, Rfl: 0   Prenatal MV-Min-Fe Fum-FA-DHA (PRENATAL 1 PO), One A Day Women's Prenatal DHA, Disp: , Rfl:    Medications ordered in this encounter:  No orders of the defined types were placed in this encounter.    *If you need refills on other medications prior to your next appointment, please contact your pharmacy*  Follow-Up: Call back or seek an in-person evaluation if the symptoms worsen or if the condition fails to improve as anticipated.  Other Instructions Please keep well-hydrated and get plenty of rest.  Run a humidifier in the bedroom at night. Start saline nasal rinses. You can use the OTC antihistamine, Chlor-Trimeton, the decongestant, pseudoephedrine, and Delsym for cough if needed while pregnant. Always review anything you pick up from the pharmacy with the pharmacist to be extra cautious.   If symptoms are not continuing to improve or resolve, let me know (messaging as discussed) and we will adjust treatment/consider antibiotic therapy.  Feel better soon!   If you have been instructed to have an in-person evaluation today at a local Urgent Care facility, please use the link below. It will take you to a list of all of our available [facility], including address, phone number and hours of operation. Please do not delay care.  [facility]  If you or a family member do not have a primary care provider, use the link below to schedule a visit and establish care. When you choose a [facility] primary care physician or advanced practice provider, you gain a long-term partner in health. Find a Primary Care Provider  Learn more about [facility]'s in-office and virtual care options: [facility]

## 2021-08-19 LAB — OB RESULTS CONSOLE ANTIBODY SCREEN: Antibody Screen: NEGATIVE

## 2021-08-19 LAB — OB RESULTS CONSOLE RUBELLA ANTIBODY, IGM: Rubella: IMMUNE

## 2021-08-19 LAB — HEPATITIS C ANTIBODY: HCV Ab: NEGATIVE

## 2021-08-19 LAB — OB RESULTS CONSOLE ABO/RH: RH Type: POSITIVE

## 2021-08-19 LAB — OB RESULTS CONSOLE RPR: RPR: NONREACTIVE

## 2021-08-19 LAB — OB RESULTS CONSOLE HIV ANTIBODY (ROUTINE TESTING): HIV: NONREACTIVE

## 2021-08-19 LAB — OB RESULTS CONSOLE HEPATITIS B SURFACE ANTIGEN: Hepatitis B Surface Ag: NEGATIVE

## 2021-08-20 MED ORDER — AMOXICILLIN 500 MG PO CAPS: 500.0000 mg | ORAL_CAPSULE | Freq: Three times a day (TID) | ORAL | 0 refills | Status: AC

## 2021-09-02 LAB — HM PAP SMEAR

## 2021-09-02 LAB — OB RESULTS CONSOLE GC/CHLAMYDIA
Chlamydia: NEGATIVE
Neisseria Gonorrhea: NEGATIVE

## 2022-03-03 LAB — OB RESULTS CONSOLE GBS: GBS: NEGATIVE

## 2022-03-22 ENCOUNTER — Encounter (HOSPITAL_COMMUNITY): Payer: Self-pay | Admitting: *Deleted

## 2022-03-22 ENCOUNTER — Telehealth (HOSPITAL_COMMUNITY): Payer: Self-pay | Admitting: *Deleted

## 2022-03-22 NOTE — Telephone Encounter (Signed)
Preadmission screen  

## 2022-03-23 ENCOUNTER — Encounter (HOSPITAL_COMMUNITY): Payer: Self-pay

## 2022-03-24 ENCOUNTER — Encounter (HOSPITAL_COMMUNITY): Payer: Self-pay | Admitting: *Deleted

## 2022-03-24 ENCOUNTER — Telehealth (HOSPITAL_COMMUNITY): Payer: Self-pay | Admitting: *Deleted

## 2022-03-24 NOTE — Telephone Encounter (Signed)
Preadmission screen  

## 2022-03-28 ENCOUNTER — Inpatient Hospital Stay (HOSPITAL_COMMUNITY): Payer: BC Managed Care – PPO | Admitting: Anesthesiology

## 2022-03-28 ENCOUNTER — Encounter (HOSPITAL_COMMUNITY): Payer: Self-pay | Admitting: Obstetrics & Gynecology

## 2022-03-28 ENCOUNTER — Other Ambulatory Visit: Payer: Self-pay

## 2022-03-28 ENCOUNTER — Inpatient Hospital Stay (HOSPITAL_COMMUNITY)
Admission: AD | Admit: 2022-03-28 | Discharge: 2022-03-29 | DRG: 807 | Disposition: A | Discharge status: Home / Self Care | Payer: BC Managed Care – PPO | Attending: Obstetrics and Gynecology | Admitting: Obstetrics and Gynecology

## 2022-03-28 DIAGNOSIS — Z3A39 39 weeks gestation of pregnancy: Secondary | ICD-10-CM

## 2022-03-28 DIAGNOSIS — O26893 Other specified pregnancy related conditions, third trimester: Secondary | ICD-10-CM | POA: Diagnosis present

## 2022-03-28 DIAGNOSIS — Z3490 Encounter for supervision of normal pregnancy, unspecified, unspecified trimester: Principal | ICD-10-CM

## 2022-03-28 LAB — CBC
HCT: 36.8 % (ref 36.0–46.0)
Hemoglobin: 12 g/dL (ref 12.0–15.0)
MCH: 27.5 pg (ref 26.0–34.0)
MCHC: 32.6 g/dL (ref 30.0–36.0)
MCV: 84.4 fL (ref 80.0–100.0)
Platelets: 267 10*3/uL (ref 150–400)
RBC: 4.36 MIL/uL (ref 3.87–5.11)
RDW: 16.2 % — ABNORMAL HIGH (ref 11.5–15.5)
WBC: 12.4 10*3/uL — ABNORMAL HIGH (ref 4.0–10.5)
nRBC: 0 % (ref 0.0–0.2)

## 2022-03-28 LAB — TYPE AND SCREEN
ABO/RH(D): O POS
Antibody Screen: NEGATIVE

## 2022-03-28 LAB — RPR: RPR Ser Ql: NONREACTIVE

## 2022-03-28 MED ORDER — LACTATED RINGERS IV SOLN
500.0000 mL | INTRAVENOUS | Status: DC | PRN
  Administered 2022-03-28: 500 mL via INTRAVENOUS

## 2022-03-28 MED ORDER — OXYCODONE HCL 5 MG PO TABS: 10.0000 mg | ORAL_TABLET | ORAL | Status: DC | PRN

## 2022-03-28 MED ORDER — FENTANYL-BUPIVACAINE-NACL 0.5-0.125-0.9 MG/250ML-% EP SOLN
12.0000 mL/h | EPIDURAL | Status: DC | PRN
  Administered 2022-03-28: 12 mL/h via EPIDURAL

## 2022-03-28 MED ORDER — LIDOCAINE HCL (PF) 1 % IJ SOLN
INTRAMUSCULAR | Status: DC | PRN
  Administered 2022-03-28: 10 mL via EPIDURAL

## 2022-03-28 MED ORDER — FENTANYL CITRATE (PF) 100 MCG/2ML IJ SOLN
INTRAMUSCULAR | Status: DC | PRN
  Administered 2022-03-28: 100 ug via EPIDURAL

## 2022-03-28 MED ORDER — COCONUT OIL OIL: 1.0000 | TOPICAL_OIL | Status: DC | PRN

## 2022-03-28 MED ORDER — IBUPROFEN 600 MG PO TABS
600.0000 mg | ORAL_TABLET | Freq: Four times a day (QID) | ORAL | Status: DC
  Administered 2022-03-28 – 2022-03-29 (×4): 600 mg via ORAL
  Filled 2022-03-28 (×4): qty 1

## 2022-03-28 MED ORDER — OXYTOCIN-SODIUM CHLORIDE 30-0.9 UT/500ML-% IV SOLN
2.5000 [IU]/h | INTRAVENOUS | Status: DC
  Administered 2022-03-28: 2.5 [IU]/h via INTRAVENOUS
  Filled 2022-03-28: qty 500

## 2022-03-28 MED ORDER — FENTANYL CITRATE (PF) 100 MCG/2ML IJ SOLN
50.0000 ug | INTRAMUSCULAR | Status: DC | PRN
  Administered 2022-03-28: 100 ug via INTRAVENOUS
  Filled 2022-03-28: qty 2

## 2022-03-28 MED ORDER — LACTATED RINGERS IV SOLN: INTRAVENOUS | Status: DC

## 2022-03-28 MED ORDER — OXYCODONE HCL 5 MG PO TABS: 5.0000 mg | ORAL_TABLET | ORAL | Status: DC | PRN

## 2022-03-28 MED ORDER — ACETAMINOPHEN 325 MG PO TABS
650.0000 mg | ORAL_TABLET | ORAL | Status: DC | PRN
Start: 2022-03-28 — End: 2022-03-28

## 2022-03-28 MED ORDER — DIPHENHYDRAMINE HCL 50 MG/ML IJ SOLN: 12.5000 mg | INTRAMUSCULAR | Status: DC | PRN

## 2022-03-28 MED ORDER — ONDANSETRON HCL 4 MG/2ML IJ SOLN: 4.0000 mg | INTRAMUSCULAR | Status: DC | PRN

## 2022-03-28 MED ORDER — SENNOSIDES-DOCUSATE SODIUM 8.6-50 MG PO TABS
2.0000 | ORAL_TABLET | ORAL | Status: DC
  Administered 2022-03-28 – 2022-03-29 (×2): 2 via ORAL
  Filled 2022-03-28 (×2): qty 2

## 2022-03-28 MED ORDER — PRENATAL MULTIVITAMIN CH
1.0000 | ORAL_TABLET | Freq: Every day | ORAL | Status: DC
  Administered 2022-03-29: 1 via ORAL
  Filled 2022-03-28: qty 1

## 2022-03-28 MED ORDER — FLEET ENEMA 7-19 GM/118ML RE ENEM
1.0000 | ENEMA | RECTAL | Status: DC | PRN
Start: 2022-03-28 — End: 2022-03-28

## 2022-03-28 MED ORDER — ONDANSETRON HCL 4 MG PO TABS: 4.0000 mg | ORAL_TABLET | ORAL | Status: DC | PRN

## 2022-03-28 MED ORDER — TETANUS-DIPHTH-ACELL PERTUSSIS 5-2.5-18.5 LF-MCG/0.5 IM SUSY: 0.5000 mL | PREFILLED_SYRINGE | Freq: Once | INTRAMUSCULAR | Status: DC

## 2022-03-28 MED ORDER — FENTANYL-BUPIVACAINE-NACL 0.5-0.125-0.9 MG/250ML-% EP SOLN
EPIDURAL | Status: AC
  Filled 2022-03-28: qty 250

## 2022-03-28 MED ORDER — EPHEDRINE 5 MG/ML INJ: 10.0000 mg | INTRAVENOUS | Status: DC | PRN

## 2022-03-28 MED ORDER — LACTATED RINGERS IV SOLN
500.0000 mL | Freq: Once | INTRAVENOUS | Status: AC
  Administered 2022-03-28: 500 mL via INTRAVENOUS

## 2022-03-28 MED ORDER — ONDANSETRON HCL 4 MG/2ML IJ SOLN
4.0000 mg | Freq: Four times a day (QID) | INTRAMUSCULAR | Status: DC | PRN
Start: 2022-03-28 — End: 2022-03-28
  Administered 2022-03-28: 4 mg via INTRAVENOUS
  Filled 2022-03-28: qty 2

## 2022-03-28 MED ORDER — PHENYLEPHRINE 80 MCG/ML (10ML) SYRINGE FOR IV PUSH (FOR BLOOD PRESSURE SUPPORT)
PREFILLED_SYRINGE | INTRAVENOUS | Status: AC
  Filled 2022-03-28: qty 10

## 2022-03-28 MED ORDER — WITCH HAZEL-GLYCERIN EX PADS: 1.0000 | MEDICATED_PAD | CUTANEOUS | Status: DC | PRN

## 2022-03-28 MED ORDER — ACETAMINOPHEN 325 MG PO TABS: 650.0000 mg | ORAL_TABLET | ORAL | Status: DC | PRN

## 2022-03-28 MED ORDER — OXYCODONE-ACETAMINOPHEN 5-325 MG PO TABS: 2.0000 | ORAL_TABLET | ORAL | Status: DC | PRN

## 2022-03-28 MED ORDER — ZOLPIDEM TARTRATE 5 MG PO TABS: 5.0000 mg | ORAL_TABLET | Freq: Every evening | ORAL | Status: DC | PRN

## 2022-03-28 MED ORDER — SOD CITRATE-CITRIC ACID 500-334 MG/5ML PO SOLN: 30.0000 mL | ORAL | Status: DC | PRN

## 2022-03-28 MED ORDER — SIMETHICONE 80 MG PO CHEW: 80.0000 mg | CHEWABLE_TABLET | ORAL | Status: DC | PRN

## 2022-03-28 MED ORDER — BENZOCAINE-MENTHOL 20-0.5 % EX AERO
1.0000 | INHALATION_SPRAY | CUTANEOUS | Status: DC | PRN
  Administered 2022-03-28: 1 via TOPICAL
  Filled 2022-03-28: qty 56

## 2022-03-28 MED ORDER — PHENYLEPHRINE 80 MCG/ML (10ML) SYRINGE FOR IV PUSH (FOR BLOOD PRESSURE SUPPORT): 80.0000 ug | PREFILLED_SYRINGE | INTRAVENOUS | Status: DC | PRN

## 2022-03-28 MED ORDER — DIBUCAINE (PERIANAL) 1 % EX OINT: 1.0000 | TOPICAL_OINTMENT | CUTANEOUS | Status: DC | PRN

## 2022-03-28 MED ORDER — LIDOCAINE HCL (PF) 1 % IJ SOLN: 30.0000 mL | INTRAMUSCULAR | Status: DC | PRN

## 2022-03-28 MED ORDER — BUPIVACAINE HCL (PF) 0.25 % IJ SOLN
INTRAMUSCULAR | Status: DC | PRN
  Administered 2022-03-28: 5 mL via EPIDURAL

## 2022-03-28 MED ORDER — OXYTOCIN BOLUS FROM INFUSION
333.0000 mL | Freq: Once | INTRAVENOUS | Status: AC
  Administered 2022-03-28: 333 mL via INTRAVENOUS

## 2022-03-28 MED ORDER — DIPHENHYDRAMINE HCL 25 MG PO CAPS: 25.0000 mg | ORAL_CAPSULE | Freq: Four times a day (QID) | ORAL | Status: DC | PRN

## 2022-03-28 MED ORDER — OXYCODONE-ACETAMINOPHEN 5-325 MG PO TABS: 1.0000 | ORAL_TABLET | ORAL | Status: DC | PRN

## 2022-03-28 MED ORDER — PHENYLEPHRINE 80 MCG/ML (10ML) SYRINGE FOR IV PUSH (FOR BLOOD PRESSURE SUPPORT)
80.0000 ug | PREFILLED_SYRINGE | INTRAVENOUS | Status: DC | PRN
Start: 2022-03-28 — End: 2022-03-28

## 2022-03-28 NOTE — MAU Note (Signed)
..  Hailey Schneider is a 31 y.o. at 57w4dhere in MAU reporting: contractions since 4 am that are now 5 minutes apart. Denies leaking of fluid. +FM. Reports bloody show.  1 cm on Thursday  Pain score: 4/10 Vitals:   03/28/22 0611  BP: 103/80  Pulse: (!) 103  Resp: 16  Temp: 98.2 F (36.8 C)  SpO2: 100%     FHT:160

## 2022-03-28 NOTE — Anesthesia Preprocedure Evaluation (Signed)
Anesthesia Evaluation  Patient identified by MRN, date of birth, ID band Patient awake    Reviewed: Allergy & Precautions, H&P , NPO status , Patient's Chart, lab work & pertinent test results  History of Anesthesia Complications (+) PONV and history of anesthetic complications  Airway Mallampati: I       Dental no notable dental hx.    Pulmonary neg pulmonary ROS,    Pulmonary exam normal        Cardiovascular negative cardio ROS Normal cardiovascular exam     Neuro/Psych negative psych ROS   GI/Hepatic negative GI ROS, Neg liver ROS,   Endo/Other  negative endocrine ROS  Renal/GU negative Renal ROS  negative genitourinary   Musculoskeletal negative musculoskeletal ROS (+)   Abdominal Normal abdominal exam  (+)   Peds  Hematology negative hematology ROS (+)   Anesthesia Other Findings   Reproductive/Obstetrics (+) Pregnancy                             Anesthesia Physical Anesthesia Plan  ASA: 2  Anesthesia Plan: Epidural   Post-op Pain Management:    Induction:   PONV Risk Score and Plan: Treatment may vary due to age or medical condition  Airway Management Planned:   Additional Equipment:   Intra-op Plan:   Post-operative Plan:   Informed Consent: I have reviewed the patients History and Physical, chart, labs and discussed the procedure including the risks, benefits and alternatives for the proposed anesthesia with the patient or authorized representative who has indicated his/her understanding and acceptance.       Plan Discussed with:   Anesthesia Plan Comments:         Anesthesia Quick Evaluation

## 2022-03-28 NOTE — H&P (Signed)
Hailey Schneider is a 31 y.o. female presenting for UCs. PNC complicated gy Hx of 9# baby and PP hemorrhage with pregnancy #1. OB History     Gravida  2   Para  1   Term  1   Preterm      AB      Living  1      SAB      IAB      Ectopic      Multiple  0   Live Births  1          Past Medical History:  Diagnosis Date   Frequent headaches    History of postpartum hemorrhage    PONV (postoperative nausea and vomiting)    Past Surgical History:  Procedure Laterality Date   WISDOM TOOTH EXTRACTION     Family History: family history includes Cancer in her mother; Hypertension in her maternal grandmother; Stroke in her maternal grandfather. Social History:  reports that she has never smoked. She has never used smokeless tobacco. She reports that she does not currently use alcohol. She reports that she does not use drugs.     Maternal Diabetes: No Genetic Screening: Normal Maternal Ultrasounds/Referrals: Normal Fetal Ultrasounds or other Referrals:  None Maternal Substance Abuse:  No Significant Maternal Medications:  None Significant Maternal Lab Results:  Group B Strep negative Number of Prenatal Visits:greater than 3 verified prenatal visits Other Comments:  None  Review of Systems  Constitutional:  Negative for fever.  Eyes:  Negative for visual disturbance.  Gastrointestinal:  Negative for abdominal pain.  Neurological:  Negative for headaches.   Maternal Medical History:  Reason for admission: Contractions.   Contractions: Onset was 3-5 hours ago.   Fetal activity: Perceived fetal activity is normal.     Dilation: 5 Effacement (%): 70 Station: -2 Exam by:: Genene Churn RN Blood pressure (!) 106/59, pulse 77, temperature 98.9 F (37.2 C), temperature source Oral, resp. rate 18, height '5\' 4"'$  (1.626 m), weight 71.8 kg, SpO2 100 %, currently breastfeeding. Maternal Exam:  Abdomen: Fetal presentation: vertex   Fetal Exam Fetal State Assessment:  Category I - tracings are normal.   Physical Exam Cardiovascular:     Rate and Rhythm: Normal rate.  Pulmonary:     Effort: Pulmonary effort is normal.     Cx 7/90/-2/VTX AROM> light meconium  Prenatal labs: ABO, Rh: --/--/O POS (09/05 0800) Antibody: NEG (09/05 0800) Rubella: Immune (01/27 0000) RPR: Nonreactive (01/27 0000)  HBsAg: Negative (01/27 0000)  HIV: Non-reactive (01/27 0000)  GBS: Negative/-- (08/11 0000)   Assessment/Plan: 31 yo G2P1 in active labor Epidural in Anticipate vaginal delivery   Shon Millet II 03/28/2022, 12:22 PM

## 2022-03-28 NOTE — Progress Notes (Signed)
Delivery Note At 2:28 PM a viable female was delivered via Vaginal, Spontaneous (Presentation: Right Occiput Anterior).  APGAR: 9, 9; weight  .   Placenta status: Spontaneous, Intact.  Cord: 3 vessels with the following complications: None.  Cord pH:   Baby crowning when cx checked. She delivered without pushing in bed.  Placed in stirrups for delivery of placenta and exam. Placenta intact.  Anesthesia: Epidural Episiotomy:   Lacerations: small 2nd degree;midline - repaired Suture Repair: 2.0 vicryl rapide Est. Blood Loss (mL): 200  Mom to postpartum.  Baby to Couplet care / Skin to Skin.  Shon Millet II 03/28/2022, 2:43 PM

## 2022-03-28 NOTE — Anesthesia Procedure Notes (Signed)
Epidural Patient location during procedure: OB Start time: 03/28/2022 8:45 AM End time: 03/28/2022 8:48 AM  Staffing Anesthesiologist: Lyn Hollingshead, MD Performed: anesthesiologist   Preanesthetic Checklist Completed: patient identified, IV checked, site marked, risks and benefits discussed, surgical consent, monitors and equipment checked, pre-op evaluation and timeout performed  Epidural Patient position: sitting Prep: DuraPrep and site prepped and draped Patient monitoring: continuous pulse ox and blood pressure Approach: midline Location: L3-L4 Injection technique: LOR air  Needle:  Needle type: Tuohy  Needle gauge: 17 G Needle length: 9 cm and 9 Needle insertion depth: 5 cm cm Catheter type: closed end flexible Catheter size: 19 Gauge Catheter at skin depth: 10 cm Test dose: negative and Other  Assessment Events: blood not aspirated, injection not painful, no injection resistance, no paresthesia and negative IV test  Additional Notes Reason for block:procedure for pain

## 2022-03-29 LAB — CBC
HCT: 31.3 % — ABNORMAL LOW (ref 36.0–46.0)
Hemoglobin: 10.3 g/dL — ABNORMAL LOW (ref 12.0–15.0)
MCH: 27.6 pg (ref 26.0–34.0)
MCHC: 32.9 g/dL (ref 30.0–36.0)
MCV: 83.9 fL (ref 80.0–100.0)
Platelets: 244 10*3/uL (ref 150–400)
RBC: 3.73 MIL/uL — ABNORMAL LOW (ref 3.87–5.11)
RDW: 16.4 % — ABNORMAL HIGH (ref 11.5–15.5)
WBC: 15.3 10*3/uL — ABNORMAL HIGH (ref 4.0–10.5)
nRBC: 0 % (ref 0.0–0.2)

## 2022-03-29 NOTE — Lactation Note (Signed)
This note was copied from a baby's chart. Lactation Consultation Note  Patient Name: Hailey Schneider [id number] Date: 03/29/2022 Reason for consult:  (RN Alonza Smoker) will ask Birth Parent if she would like to be seen tonight by [facility] services.) Age:29 hours Birth Parent declined [facility] services on [place] tonight would like be seen in morning.  Maternal Data    Feeding    LATCH Score                    Lactation Tools Discussed/Used    Interventions    Discharge    Consult Status      Hailey Schneider 03/29/2022, 12:48 AM

## 2022-03-29 NOTE — Discharge Summary (Signed)
Postpartum Discharge Summary    Patient Name: Hailey Schneider DOB: January 01, 1991 MRN: [id number]  Date of admission: 03/28/2022 Delivery date:03/28/2022  Delivering provider: Everlene Farrier  Date of discharge: 03/29/2022  Admitting diagnosis: Term pregnancy [Z34.90] Intrauterine pregnancy: [redacted]w[redacted]d    Secondary diagnosis:  Principal Problem:   Term pregnancy  Additional problems: None    Discharge diagnosis: Term Pregnancy Delivered                                              Post partum procedures: None Augmentation: AROM Complications: None  Hospital course: Onset of Labor With Vaginal Delivery      31y.o. yo [id number] admitted in Latent Labor on 03/28/2022. Patient had an uncomplicated labor course as follows:  Membrane Rupture Time/Date: 12:15 PM ,03/28/2022   Delivery Method:Vaginal, Spontaneous  Episiotomy: None  Lacerations:  2nd degree;Perineal  Patient had an uncomplicated postpartum course.  She is ambulating, tolerating a regular diet, passing flatus, and urinating well. Patient is discharged home in stable condition on 03/29/22.  Newborn Data: Birth date:03/28/2022  Birth time:2:28 PM  Gender:Female  Living status:Living  Apgars:9 ,9  Weight:4230 g   Magnesium Sulfate received: No BMZ received: No Rhophylac:N/A MMR:N/A T-DaP:Given prenatally Flu: N/A Transfusion:No  Physical exam  Vitals:   03/28/22 1709 03/28/22 2146 03/29/22 0021 03/29/22 0528  BP: 124/70 (!) 118/57 111/76 (!) 86/55  Pulse: 95 70 70 78  Resp: '18 16 16 18  ' Temp: 98.5 F (36.9 C) 98.1 F (36.7 C) 97.9 F (36.6 C) 97.6 F (36.4 C)  TempSrc: Oral Oral Oral Oral  SpO2:  99% 100% 99%  Weight:      Height:       General: alert Lochia: appropriate Uterine Fundus: firm Incision: N/A DVT Evaluation: No evidence of DVT seen on physical exam. Labs: Lab Results  Component Value Date   WBC 15.3 (H) 03/29/2022   HGB 10.3 (L) 03/29/2022   HCT 31.3 (L) 03/29/2022   MCV 83.9  03/29/2022   PLT 244 03/29/2022      Latest Ref Rng & Units 11/27/2019    1:59 PM  CMP  Glucose 70 - 99 mg/dL 120   BUN 6 - 20 mg/dL 10   Creatinine 0.44 - 1.00 mg/dL 0.87   Sodium 135 - 145 mmol/L 137   Potassium 3.5 - 5.1 mmol/L 3.8   Chloride 98 - 111 mmol/L 102   CO2 22 - 32 mmol/L 24   Calcium 8.9 - 10.3 mg/dL 8.6   Total Protein 6.5 - 8.1 g/dL 6.6   Total Bilirubin 0.3 - 1.2 mg/dL 0.7   Alkaline Phos 38 - 126 U/L 113   AST 15 - 41 U/L 16   ALT 0 - 44 U/L 23    Edinburgh Score:    03/28/2022    4:19 PM  Edinburgh Postnatal Depression Scale Screening Tool  I have been able to laugh and see the funny side of things. 0  I have looked forward with enjoyment to things. 0  I have blamed myself unnecessarily when things went wrong. 0  I have been anxious or worried for no good reason. 0  I have felt scared or panicky for no good reason. 0  Things have been getting on top of me. 0  I have been so unhappy that I have had  difficulty sleeping. 0  I have felt sad or miserable. 0  I have been so unhappy that I have been crying. 0  The thought of harming myself has occurred to me. 0  Edinburgh Postnatal Depression Scale Total 0      After visit meds:  Allergies as of 03/29/2022   No Known Allergies      Medication List     TAKE these medications    ibuprofen 600 MG tablet Commonly known as: ADVIL Take 1 tablet (600 mg total) by mouth every 6 (six) hours as needed.   PRENATAL 1 PO One A Day Women's Prenatal DHA       D/W patient female infant circumcision, risks/benefits reviewed. All questions answered.   Discharge home in stable condition Infant Feeding: Bottle and Breast Infant Disposition:home with mother Discharge instruction: per After Visit Summary and Postpartum booklet. Activity: Advance as tolerated. Pelvic rest for 6 weeks.  Diet: routine diet Anticipated Birth Control: Unsure Postpartum Appointment:6 weeks Additional Postpartum F/U:  None Future  Appointments:No future appointments. Follow up Visit:      03/29/2022 Tyson Dense, MD

## 2022-03-29 NOTE — Anesthesia Postprocedure Evaluation (Signed)
Anesthesia Post Note  Patient: Therapist, sports  Procedure(s) Performed: AN AD HOC LABOR EPIDURAL     Patient location during evaluation: Mother Baby Anesthesia Type: Epidural Level of consciousness: awake and alert and oriented Pain management: satisfactory to patient Vital Signs Assessment: post-procedure vital signs reviewed and stable Respiratory status: respiratory function stable Cardiovascular status: stable Postop Assessment: no headache, no backache, epidural receding, patient able to bend at knees, no signs of nausea or vomiting, adequate PO intake and able to ambulate Anesthetic complications: no   No notable events documented.  Last Vitals:  Vitals:   03/29/22 0021 03/29/22 0528  BP: 111/76 (!) 86/55  Pulse: 70 78  Resp: 16 18  Temp: 36.6 C 36.4 C  SpO2: 100% 99%    Last Pain:  Vitals:   03/29/22 0817  TempSrc:   PainSc: 0-No pain   Pain Goal: Patients Stated Pain Goal: 3 (03/28/22 0925)                 Katherina Mires

## 2022-03-29 NOTE — Lactation Note (Signed)
This note was copied from a baby's chart. Lactation Consultation Note  Patient Name: Hailey Schneider [id number] Date: 03/29/2022 Reason for consult: Initial assessment;Term Age:29 hours   P2: Term infant at 39+4 weeks Feeding preference: Breast  Arrived to find "[name]" STS on birth parent's chest and showing cues.  Offered to assist with latching; birth parent receptive.  Assisted to latch easily and he eagerly began sucking.  Observed for 10 minutes before leaving the room.  Answered all questions and reviewed engorgement since parent's desire a 24 hour discharge.  RN in room to update feedings prior to baby's circumcision.  Support person present.   Maternal Data Has patient been taught Hand Expression?: Yes Does the patient have breastfeeding experience prior to this delivery?: Yes How long did the patient breastfeed?: 18 months  Feeding Mother's Current Feeding Choice: Breast Milk  LATCH Score Latch: Grasps breast easily, tongue down, lips flanged, rhythmical sucking.  Audible Swallowing: None  Type of Nipple: Everted at rest and after stimulation  Comfort (Breast/Nipple): Soft / non-tender  Hold (Positioning): Assistance needed to correctly position infant at breast and maintain latch.  LATCH Score: 7   Lactation Tools Discussed/Used    Interventions Interventions: Breast feeding basics reviewed;Assisted with latch;Skin to skin;Breast massage;Hand express;Adjust position;Support pillows;Position options;Expressed milk;Education;LC Services brochure  Discharge Pump: Personal (Spectra)  Consult Status Consult Status: Complete    Strider Vallance R Grover [place]field 03/29/2022, 8:28 AM

## 2022-03-30 ENCOUNTER — Inpatient Hospital Stay (HOSPITAL_COMMUNITY)
Admission: AD | Admit: 2022-03-30 | Payer: BC Managed Care – PPO | Source: Home / Self Care | Admitting: Obstetrics and Gynecology

## 2022-03-30 ENCOUNTER — Inpatient Hospital Stay (HOSPITAL_COMMUNITY): Payer: BC Managed Care – PPO

## 2022-04-06 ENCOUNTER — Telehealth (HOSPITAL_COMMUNITY): Payer: Self-pay | Admitting: *Deleted

## 2022-04-06 NOTE — Telephone Encounter (Signed)
Attempted Hospital Discharge Follow-Up Call.  Left voice mail requesting that patient return RN's phone call if patient has any concerns or questions regarding herself or her baby.  

## 2022-07-02 ENCOUNTER — Telehealth: Payer: BC Managed Care – PPO | Admitting: Physician Assistant

## 2022-07-02 DIAGNOSIS — J019 Acute sinusitis, unspecified: Secondary | ICD-10-CM

## 2022-07-02 DIAGNOSIS — B9689 Other specified bacterial agents as the cause of diseases classified elsewhere: Secondary | ICD-10-CM

## 2022-07-02 MED ORDER — AMOXICILLIN 875 MG PO TABS: 875.0000 mg | ORAL_TABLET | Freq: Two times a day (BID) | ORAL | 0 refills | Status: AC

## 2022-07-02 NOTE — Progress Notes (Signed)

## 2022-09-25 NOTE — Progress Notes (Unsigned)
    Hailey Schneider D.[place] Phone: [phone]   Assessment and Plan:     There are no diagnoses linked to this encounter.  ***   Pertinent previous records reviewed include ***   Follow Up: ***     Subjective:   I, Hailey Schneider, am serving as a Education administrator for Doctor Glennon Mac  Chief Complaint: left hip pain   HPI:   09/26/2022 Patient is a 32 year old female complaining of left hip pain. Patient states  Relevant Historical Information: ***  Additional pertinent review of systems negative.   Current Outpatient Medications:    ferrous sulfate 325 (65 FE) MG tablet, Take 325 mg by mouth daily with breakfast., Disp: , Rfl:    Prenatal MV-Min-Fe Fum-FA-DHA (PRENATAL 1 PO), Take 1 tablet by mouth daily., Disp: , Rfl:    Objective:     There were no vitals filed for this visit.    There is no height or weight on file to calculate BMI.    Physical Exam:    ***   Electronically signed by:  Hailey Schneider D.[facility] 11:58 AM 09/25/22

## 2022-09-26 ENCOUNTER — Ambulatory Visit: Payer: BC Managed Care – PPO | Admitting: Sports Medicine

## 2022-09-26 ENCOUNTER — Ambulatory Visit (INDEPENDENT_AMBULATORY_CARE_PROVIDER_SITE_OTHER): Payer: BC Managed Care – PPO

## 2022-09-26 VITALS — BP 110/78 | HR 78 | Ht 64.0 in | Wt 158.0 lb

## 2022-09-26 DIAGNOSIS — R102 Pelvic and perineal pain: Secondary | ICD-10-CM

## 2022-09-26 DIAGNOSIS — M6289 Other specified disorders of muscle: Secondary | ICD-10-CM | POA: Diagnosis not present

## 2022-09-26 NOTE — Therapy (Unsigned)
OUTPATIENT PHYSICAL THERAPY FEMALE PELVIC EVALUATION   Patient Name: Hailey Schneider MRN: [id number] DOB:[birth year], 32 y.o., female Today's Date: 09/27/2022  END OF SESSION:  PT End of Session - 09/27/22 1709     Visit Number 1    Date for PT Re-Evaluation 12/20/22    Authorization Type BCBS    PT Start Time 1615    PT Stop Time 1650    PT Time Calculation (min) 35 min    Activity Tolerance Patient tolerated treatment well    Behavior During Therapy WFL for tasks assessed/performed             Past Medical History:  Diagnosis Date   Frequent headaches    History of postpartum hemorrhage    PONV (postoperative nausea and vomiting)    Past Surgical History:  Procedure Laterality Date   WISDOM TOOTH EXTRACTION     Patient Active Problem List   Diagnosis Date Noted   Term pregnancy 03/28/2022   Strain of iliopsoas muscle, left, initial encounter 03/12/2020   Lipoma of right upper extremity 03/12/2020    PCP: Shelda Pal, DO  REFERRING PROVIDER: Glennon Mac, DO   REFERRING DIAG:  R10.2 (ICD-10-CM) - Pain in pelvis  M62.89 (ICD-10-CM) - Pelvic floor dysfunction in female    THERAPY DIAG:  Other muscle spasm  Muscle weakness (generalized)  Pain in left hip  Rationale for Evaluation and Treatment: Rehabilitation  ONSET DATE: 2/21  SUBJECTIVE:                                                                                                                                                                                           SUBJECTIVE STATEMENT: Patient initially had pain during first pregnancy and resolved. The second time she had pain was during her second pregnancy 2/23 and still has the pain. 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 Patient had pain on the lateral sides of the pelvic floor when she had a gynecological exam.   PAIN:  Are you having pain? Yes NPRS scale: 2-6/10 Pain location:  left groin and radiate into the left  hip  Pain  type: sharp Pain description: intermittent   Aggravating factors: walking, standing, carrying weight Relieving factors: sitting  PRECAUTIONS: None  WEIGHT BEARING RESTRICTIONS: No  FALLS:  Has patient fallen in last 6 months? No  LIVING ENVIRONMENT: Lives with: lives with their family  OCCUPATION: teacher  PLOF: Independent  PATIENT GOALS: reduce pain  PERTINENT HISTORY:  None  BOWEL MOVEMENT: No issues  URINATION:no urinary leakage  INTERCOURSE:No pain with intercourse   PREGNANCY: Vaginal deliveries 2, last child delivered on 03/28/22 with second  degree perineal tear Tearing Yes: second degree tear with both   OBJECTIVE:    COGNITION: Overall cognitive status: Within functional limits for tasks assessed     SENSATION: Light touch: Appears intact Proprioception: Appears intact     POSTURE: No Significant postural limitations  PELVIC ALIGNMENT: left ilium rotated left  LUMBARAROM/PROM:  A/PROM A/PROM  eval  Right rotation Decreased by 25%   (Blank rows = not tested)  LOWER EXTREMITY ROM:  Passive ROM Right eval Left eval  Hip extension  -5  Hip internal rotation  10   (Blank rows = not tested)  LOWER EXTREMITY MMT:  MMT Right eval Left eval  Hip extension 5/5 4/5  Hip abduction 4/5 3+/5  Hip adduction 4/5 4/5   PALPATION:   General  difficulty with contracting the lower abdominals, tightness in the L3-L5 vertebrae, tightness in the anterior left hip capsule                External Perineal Exam tenderness located on the left levator                             Internal Pelvic Floor tenderness located in the obturator internist, levator ani with left worse than right  Patient confirms identification and approves PT to assess internal pelvic floor and treatment Yes  PELVIC MMT:   MMT eval  Vaginal 2/5  (Blank rows = not tested)          TODAY'S TREATMENT:                                                                                                                               DATE: 09/27/22  EVAL see below   PATIENT EDUCATION:  Education details: Access Code: [email or web address] Person educated: Patient Education method: Consulting civil engineer, Media planner, Corporate treasurer cues, Verbal cues, and Handouts Education comprehension: verbalized understanding, returned demonstration, verbal cues required, tactile cues required, and needs further education  HOME EXERCISE PROGRAM: 09/27/22 Access Code: [email or web address] URL: [email or web address] Date: 09/27/2022 Prepared by: Earlie Counts  Program Notes massage the pelvic floor for 5 mintues  3 times per week  Exercises - Hooklying Transversus Abdominis Palpation  - 1 x daily - 7 x weekly - 1 sets - 10 reps  ASSESSMENT:  CLINICAL IMPRESSION: Patient is a 32 y.o. female who was seen today for physical therapy evaluation and treatment for pelvic pain. Patient reports her left hip pain ranges from 4-6/10. Pain is intermittent. Her left ilium is rotated posteriorly, Tightness in left anterior hip capsule. Decreased ROM of left hip extension and internal rotation. Decreased right lumbar rotation. She has tenderness located in bilateral levator ani and obturator internist with left worse than right. Pelvic floor strength is 2/5. She has difficulty with contraction of lower abdominals and weakness in hip abductors. Patient will benefit from skilled therapy to reduce her pain and improve her mobility.    OBJECTIVE  IMPAIRMENTS: decreased ROM, decreased strength, increased fascial restrictions, increased muscle spasms, and pain.   ACTIVITY LIMITATIONS: carrying, lifting, standing, and locomotion level  PARTICIPATION LIMITATIONS: meal prep, cleaning, laundry, community activity, and occupation  PERSONAL FACTORS: Time since onset of injury/illness/exacerbation are also affecting patient's functional outcome.   REHAB POTENTIAL: Excellent  CLINICAL DECISION MAKING:  Stable/uncomplicated  EVALUATION COMPLEXITY: Low   GOALS: Goals reviewed with patient? Yes  SHORT TERM GOALS: Target date: 10/24/22  Independent with initial HEP Baseline: Goal status: INITIAL  2.  Patient reports her pain decreased >/= 25%.  Baseline:  Goal status: INITIAL   LONG TERM GOALS: Target date: 12/20/22  Patient independent with advanced HEP for pelvic floor, core and hip strength to improve stability.  Baseline:  Goal status: INITIAL  2.  Patient has full ROM of left hip and lumbar to reduce trigger points in the pelvic floor muscles.  Baseline:  Goal status: INITIAL  3.  Patient bilateral hip strength >/= 4+/5 to stabilize the left SI joint.  Baseline:  Goal status: INITIAL  4.  Patient is able to lift items with correct body mechanics with left hip pain decreased >/= 80%.  Baseline:  Goal status: INITIAL  5.  Patient is able to walk for 45 minutes with left hip pain decreased >/= 80% Baseline:  Goal status: INITIAL   PLAN:  PT FREQUENCY: 1x/week  PT DURATION: 12 weeks  PLANNED INTERVENTIONS: Therapeutic exercises, Therapeutic activity, Neuromuscular re-education, Patient/Family education, Joint mobilization, Dry Needling, Electrical stimulation, Spinal mobilization, Cryotherapy, Moist heat, and Manual therapy  PLAN FOR NEXT SESSION: lower abdominal strength, manual work to pelvic floor, check SI joint, hip strength  Earlie Counts, PT 09/27/22 5:13 PM

## 2022-09-26 NOTE — Patient Instructions (Addendum)
Good to see you Tylenol [phone] mg 2-3 times a day for pain relief  Pelvic floor PT  Recommend Voltaren gel topically as needed  4-6 week follow up

## 2022-09-27 ENCOUNTER — Encounter: Payer: Self-pay | Admitting: Physical Therapy

## 2022-09-27 ENCOUNTER — Other Ambulatory Visit: Payer: Self-pay

## 2022-09-27 ENCOUNTER — Ambulatory Visit: Payer: BC Managed Care – PPO | Attending: Sports Medicine | Admitting: Physical Therapy

## 2022-09-27 DIAGNOSIS — M6281 Muscle weakness (generalized): Secondary | ICD-10-CM | POA: Diagnosis not present

## 2022-09-27 DIAGNOSIS — M6289 Other specified disorders of muscle: Secondary | ICD-10-CM | POA: Insufficient documentation

## 2022-09-27 DIAGNOSIS — M25552 Pain in left hip: Secondary | ICD-10-CM | POA: Diagnosis not present

## 2022-09-27 DIAGNOSIS — R102 Pelvic and perineal pain: Secondary | ICD-10-CM | POA: Diagnosis present

## 2022-09-27 DIAGNOSIS — M62838 Other muscle spasm: Secondary | ICD-10-CM | POA: Diagnosis not present

## 2022-10-23 ENCOUNTER — Ambulatory Visit: Payer: BC Managed Care – PPO | Admitting: Sports Medicine

## 2022-10-30 ENCOUNTER — Ambulatory Visit: Payer: BC Managed Care – PPO | Admitting: Physical Therapy

## 2022-11-20 ENCOUNTER — Ambulatory Visit: Payer: BC Managed Care – PPO | Attending: Sports Medicine | Admitting: Physical Therapy

## 2022-11-20 ENCOUNTER — Encounter: Payer: Self-pay | Admitting: Physical Therapy

## 2022-11-20 DIAGNOSIS — M6281 Muscle weakness (generalized): Secondary | ICD-10-CM | POA: Insufficient documentation

## 2022-11-20 DIAGNOSIS — M25552 Pain in left hip: Secondary | ICD-10-CM | POA: Diagnosis present

## 2022-11-20 DIAGNOSIS — M62838 Other muscle spasm: Secondary | ICD-10-CM

## 2022-11-20 NOTE — Therapy (Signed)
OUTPATIENT PHYSICAL THERAPY TREATMENT NOTE   Patient Name: Hailey Schneider MRN: [id number] DOB:[birth year], 32 y.o., female Today's Date: 11/20/2022  PCP:  Sharlene Dory, DO  REFERRING PROVIDER:  Richardean Sale, DO   END OF SESSION:   PT End of Session - 11/20/22 1614     Visit Number 2    Date for PT Re-Evaluation 12/20/22    Authorization Type [employer]    PT Start Time 1615    PT Stop Time 1655    PT Time Calculation (min) 40 min    Activity Tolerance Patient tolerated treatment well    Behavior During Therapy WFL for tasks assessed/performed             Past Medical History:  Diagnosis Date   Frequent headaches    History of postpartum hemorrhage    PONV (postoperative nausea and vomiting)    Past Surgical History:  Procedure Laterality Date   WISDOM TOOTH EXTRACTION     Patient Active Problem List   Diagnosis Date Noted   Term pregnancy 03/28/2022   Strain of iliopsoas muscle, left, initial encounter 03/12/2020   Lipoma of right upper extremity 03/12/2020   REFERRING DIAG:  R10.2 (ICD-10-CM) - Pain in pelvis  M62.89 (ICD-10-CM) - Pelvic floor dysfunction in female      THERAPY DIAG:  Other muscle spasm   Muscle weakness (generalized)   Pain in left hip   Rationale for Evaluation and Treatment: Rehabilitation   ONSET DATE: 2/21   SUBJECTIVE:                                                                                                                                                                                            SUBJECTIVE STATEMENT: I feel much better since last visit. The groin pain happens when she strains, walking. Pain is not as often.     PAIN:  Are you having pain? Yes NPRS scale: 2-6/10 Pain location:  left groin and radiate into the left  hip   Pain type: sharp Pain description: intermittent    Aggravating factors: walking, standing, carrying weight Relieving factors: sitting   PRECAUTIONS: None   WEIGHT  BEARING RESTRICTIONS: No   FALLS:  Has patient fallen in last 6 months? No   LIVING ENVIRONMENT: Lives with: lives with their family   OCCUPATION: teacher   PLOF: Independent   PATIENT GOALS: reduce pain   PERTINENT HISTORY:  None   BOWEL MOVEMENT: No issues   URINATION:no urinary leakage   INTERCOURSE:No pain with intercourse     PREGNANCY: Vaginal deliveries 2, last child delivered on 03/28/22 with second degree perineal tear  Tearing Yes: second degree tear with both     OBJECTIVE:      COGNITION: Overall cognitive status: Within functional limits for tasks assessed                          SENSATION: Light touch: Appears intact Proprioception: Appears intact         POSTURE: No Significant postural limitations   PELVIC ALIGNMENT: left ilium rotated left   LUMBARAROM/PROM:   A/PROM A/PROM  eval  Right rotation Decreased by 25%   (Blank rows = not tested)   LOWER EXTREMITY ROM:   Passive ROM Right eval Left eval  Hip extension   -5  Hip internal rotation   10   (Blank rows = not tested)   LOWER EXTREMITY MMT:   MMT Right eval Left eval  Hip extension 5/5 4/5  Hip abduction 4/5 3+/5  Hip adduction 4/5 4/5    PALPATION:   General  difficulty with contracting the lower abdominals, tightness in the L3-L5 vertebrae, tightness in the anterior left hip capsule                 External Perineal Exam tenderness located on the left levator                             Internal Pelvic Floor tenderness located in the obturator internist, levator ani with left worse than right   Patient confirms identification and approves PT to assess internal pelvic floor and treatment Yes   PELVIC MMT:   MMT eval 11/20/22  Vaginal 2/5 3/5  (Blank rows = not tested)             TODAY'S TREATMENT:       11/20/22 Manual: Spinal mobilization: PA and rotational mobilization of L3-L5 Sacral mobilization for extension and correct rotation Internal pelvic floor  techniques: No emotional/communication barriers or cognitive limitation. Patient is motivated to learn. Patient understands and agrees with treatment goals and plan. PT explains patient will be examined in standing, sitting, and lying down to see how their muscles and joints work. When they are ready, they will be asked to remove their underwear so PT can examine their perineum. The patient is also given the option of providing their own chaperone as one is not provided in our facility. The patient also has the right and is explained the right to defer or refuse any part of the evaluation or treatment including the internal exam. With the patient's consent, PT will use one gloved finger to gently assess the muscles of the pelvic floor, seeing how well it contracts and relaxes and if there is muscle symmetry. After, the patient will get dressed and PT and patient will discuss exam findings and plan of care. PT and patient discuss plan of care, schedule, attendance policy and HEP activities.  Going through the vaginal canal working on the iliococcygeus, puborectalis, with hip movement Exercises: Strengthening: Quadruped lifting opposite arm and leg with pelvic floor contraction Supine ball squeeze with pelvic floor contraction and lower abdominal contraction holding 5 sec 15 x  Supine march with ball between knees and core contraction   PATIENT EDUCATION:  11/20/22 Education details: Access Code: [employer] Person educated: Patient Education method: Programmer, multimedia, Facilities manager, Actor cues, Verbal cues, and Handouts Education comprehension: verbalized understanding, returned demonstration, verbal cues required, tactile cues required, and needs further education   HOME EXERCISE PROGRAM: 11/20/22  Access Code: [email or web address] URL: [email or web address] Date: 11/20/2022 Prepared by: Eulis Foster  Exercises - Hooklying Transversus Abdominis Palpation  - 1 x daily - 7 x weekly - 1 sets - 10 reps -  Supine March  - 1 x daily - 3 x weekly - 1 sets - 10 reps - Quadruped Pelvic Floor Contraction with Opposite Arm and Leg Lift  - 1 x daily - 3 x weekly - 2 sets - 10 reps - Seated Pelvic Floor Contraction  - 3 x daily - 7 x weekly - 1 sets - 10 reps - 5 sec hold   ASSESSMENT:   CLINICAL IMPRESSION: Patient is a 32 y.o. female who was seen today for physical therapy  treatment for pelvic pain. Pelvic floor strength increased to 3/5. Pelvis in correct alignment after manual work.  Patient felt loser after therapy. Patient will benefit from skilled therapy to reduce her pain and improve her mobility.     OBJECTIVE IMPAIRMENTS: decreased ROM, decreased strength, increased fascial restrictions, increased muscle spasms, and pain.    ACTIVITY LIMITATIONS: carrying, lifting, standing, and locomotion level   PARTICIPATION LIMITATIONS: meal prep, cleaning, laundry, community activity, and occupation   PERSONAL FACTORS: Time since onset of injury/illness/exacerbation are also affecting patient's functional outcome.    REHAB POTENTIAL: Excellent   CLINICAL DECISION MAKING: Stable/uncomplicated   EVALUATION COMPLEXITY: Low     GOALS: Goals reviewed with patient? Yes   SHORT TERM GOALS: Target date: 10/24/22   Independent with initial HEP Baseline: Goal status: Met 11/20/22   2.  Patient reports her pain decreased >/= 25%.  Baseline:  Goal status: INITIAL     LONG TERM GOALS: Target date: 12/20/22   Patient independent with advanced HEP for pelvic floor, core and hip strength to improve stability.  Baseline:  Goal status: INITIAL   2.  Patient has full ROM of left hip and lumbar to reduce trigger points in the pelvic floor muscles.  Baseline:  Goal status: INITIAL   3.  Patient bilateral hip strength >/= 4+/5 to stabilize the left SI joint.  Baseline:  Goal status: INITIAL   4.  Patient is able to lift items with correct body mechanics with left hip pain decreased >/= 80%.   Baseline:  Goal status: INITIAL   5.  Patient is able to walk for 45 minutes with left hip pain decreased >/= 80% Baseline:  Goal status: INITIAL     PLAN:   PT FREQUENCY: 1x/week   PT DURATION: 12 weeks   PLANNED INTERVENTIONS: Therapeutic exercises, Therapeutic activity, Neuromuscular re-education, Patient/Family education, Joint mobilization, Dry Needling, Electrical stimulation, Spinal mobilization, Cryotherapy, Moist heat, and Manual therapy   PLAN FOR NEXT SESSION: lower abdominal strength, check SI joint, hip strength    Eulis Foster, PT 11/20/22 4:55 PM

## 2022-12-04 ENCOUNTER — Encounter: Payer: Self-pay | Admitting: Physical Therapy

## 2022-12-04 ENCOUNTER — Ambulatory Visit: Payer: BC Managed Care – PPO | Attending: Sports Medicine | Admitting: Physical Therapy

## 2022-12-04 DIAGNOSIS — M25552 Pain in left hip: Secondary | ICD-10-CM | POA: Diagnosis present

## 2022-12-04 DIAGNOSIS — M62838 Other muscle spasm: Secondary | ICD-10-CM | POA: Diagnosis present

## 2022-12-04 DIAGNOSIS — M6281 Muscle weakness (generalized): Secondary | ICD-10-CM | POA: Insufficient documentation

## 2022-12-04 NOTE — Therapy (Addendum)
OUTPATIENT PHYSICAL THERAPY TREATMENT NOTE   Patient Name: Hailey Schneider MRN: [id number] DOB:1991/05/11, 32 y.o., female Today's Date: 12/04/2022  PCP: Sharlene Dory, DO  REFERRING PROVIDER: Richardean Sale, DO   END OF SESSION:   PT End of Session - 12/04/22 1615     Visit Number 3    Date for PT Re-Evaluation 12/20/22    Authorization Type BC[employer]    PT Start Time 1615    PT Stop Time 1655    PT Time Calculation (min) 40 min    Activity Tolerance Patient tolerated treatment well    Behavior During Therapy WFL for tasks assessed/performed             Past Medical History:  Diagnosis Date   Frequent headaches    History of postpartum hemorrhage    PONV (postoperative nausea and vomiting)    Past Surgical History:  Procedure Laterality Date   WISDOM TOOTH EXTRACTION     Patient Active Problem List   Diagnosis Date Noted   Term pregnancy 03/28/2022   Strain of iliopsoas muscle, left, initial encounter 03/12/2020   Lipoma of right upper extremity 03/12/2020   REFERRING DIAG:  R10.2 (ICD-10-CM) - Pain in pelvis  M62.89 (ICD-10-CM) - Pelvic floor dysfunction in female      THERAPY DIAG:  Other muscle spasm   Muscle weakness (generalized)   Pain in left hip   Rationale for Evaluation and Treatment: Rehabilitation   ONSET DATE: 2/21   SUBJECTIVE:                                                                                                                                                                                            SUBJECTIVE STATEMENT: I am doing the exercises. I get a pain down the left post. Thigh when I strain. I have some inner thigh and posterior hip pain with physical activities.       PAIN:  Are you having pain? Yes NPRS scale: 3-6/10 Pain location:  left groin and radiate into the left  hip   Pain type: sharp Pain description: intermittent    Aggravating factors: walking Relieving factors: sitting   PRECAUTIONS:  None   WEIGHT BEARING RESTRICTIONS: No   FALLS:  Has patient fallen in last 6 months? No   LIVING ENVIRONMENT: Lives with: lives with their family   OCCUPATION: teacher   PLOF: Independent   PATIENT GOALS: reduce pain   PERTINENT HISTORY:  None   BOWEL MOVEMENT: No issues   URINATION:no urinary leakage   INTERCOURSE:No pain with intercourse     PREGNANCY: Vaginal deliveries 2, last child delivered on  03/28/22 with second degree perineal tear Tearing Yes: second degree tear with both     OBJECTIVE:      COGNITION: Overall cognitive status: Within functional limits for tasks assessed                          SENSATION: Light touch: Appears intact Proprioception: Appears intact         POSTURE: No Significant postural limitations   PELVIC ALIGNMENT: left ilium rotated left 12/04/22 left ilium posteriorly rotated.    LUMBARAROM/PROM:   A/PROM A/PROM  eval  Right rotation Decreased by 25%   (Blank rows = not tested)   LOWER EXTREMITY ROM:   Passive ROM Right eval Left eval  Hip extension   -5  Hip internal rotation   10   (Blank rows = not tested)   LOWER EXTREMITY MMT:   MMT Right eval Left eval Left  12/04/22  Hip extension 5/5 4/5 4/5  Hip abduction 4/5 3+/5 4/5  Hip adduction 4/5 4/5 4/5    PALPATION:   General  difficulty with contracting the lower abdominals, tightness in the L3-L5 vertebrae, tightness in the anterior left hip capsule                 External Perineal Exam tenderness located on the left levator                             Internal Pelvic Floor tenderness located in the obturator internist, levator ani with left worse than right   Patient confirms identification and approves PT to assess internal pelvic floor and treatment Yes   PELVIC MMT:   MMT eval 11/20/22  Vaginal 2/5 3/5  (Blank rows = not tested)             TODAY'S TREATMENT:   12/04/22 Manual: Soft tissue mobilization: Deep manual work to bilateral  hip adductors and quads Myofascial release: Tissue rolling of the hp adductors and quadriceps Spinal mobilization: PA glide of left SI joint Exercises: Stretches/mobility: Frog leg stretch with rocking pelvis back and [place] rotation with overpressure stretch Strengthening: Supine march with tactile cues to not rock pelvis and engage the abdominalis Quadruped lift alternate arm and leg Bridge going slowly 20 x         11/20/22 Manual: Spinal mobilization: PA and rotational mobilization of L3-L5 Sacral mobilization for extension and correct rotation Internal pelvic floor techniques: No emotional/communication barriers or cognitive limitation. Patient is motivated to learn. Patient understands and agrees with treatment goals and plan. PT explains patient will be examined in standing, sitting, and lying down to see how their muscles and joints work. When they are ready, they will be asked to remove their underwear so PT can examine their perineum. The patient is also given the option of providing their own chaperone as one is not provided in our facility. The patient also has the right and is explained the right to defer or refuse any part of the evaluation or treatment including the internal exam. With the patient's consent, PT will use one gloved finger to gently assess the muscles of the pelvic floor, seeing how well it contracts and relaxes and if there is muscle symmetry. After, the patient will get dressed and PT and patient will discuss exam findings and plan of care. PT and patient discuss plan of care, schedule, attendance policy and HEP activities.  Going through  the vaginal canal working on the iliococcygeus, puborectalis, with hip movement Exercises: Strengthening: Quadruped lifting opposite arm and leg with pelvic floor contraction Supine ball squeeze with pelvic floor contraction and lower abdominal contraction holding 5 sec 15 x  Supine march with ball between knees and core  contraction   PATIENT EDUCATION:  12/04/22 Education details: Access Code: [employer] Person educated: Patient Education method: Programmer, multimedia, Facilities manager, Actor cues, Verbal cues, and Handouts Education comprehension: verbalized understanding, returned demonstration, verbal cues required, tactile cues required, and needs further education   HOME EXERCISE PROGRAM: 12/04/22 Access Code: [facility] URL: [email or web address] Date: 12/04/2022 Prepared by: Eulis Foster  Exercises - Hooklying Transversus Abdominis Palpation  - 1 x daily - 7 x weekly - 1 sets - 10 reps - Supine March  - 1 x daily - 3 x weekly - 1 sets - 10 reps - Quadruped Pelvic Floor Contraction with Opposite Arm and Leg Lift  - 1 x daily - 3 x weekly - 2 sets - 10 reps - Seated Pelvic Floor Contraction  - 3 x daily - 7 x weekly - 1 sets - 10 reps - 5 sec hold - Supine Bridge  - 1 x daily - 3 x weekly - 2 sets - 10 reps   ASSESSMENT:   CLINICAL IMPRESSION: Patient is a 32 y.o. female who was seen today for physical therapy  treatment for pelvic pain. Patient had trigger points in the bilateral hip adductors. Her pelvis was in correct alignment after therapy. She had no left leg pain after therapy. Patient has increased strength of left hip abduction. Patient will benefit from skilled therapy to reduce her pain and improve her mobility.     OBJECTIVE IMPAIRMENTS: decreased ROM, decreased strength, increased fascial restrictions, increased muscle spasms, and pain.    ACTIVITY LIMITATIONS: carrying, lifting, standing, and locomotion level   PARTICIPATION LIMITATIONS: meal prep, cleaning, laundry, community activity, and occupation   PERSONAL FACTORS: Time since onset of injury/illness/exacerbation are also affecting patient's functional outcome.    REHAB POTENTIAL: Excellent   CLINICAL DECISION MAKING: Stable/uncomplicated   EVALUATION COMPLEXITY: Low     GOALS: Goals reviewed with patient? Yes    SHORT TERM GOALS: Target date: 10/24/22   Independent with initial HEP Baseline: Goal status: Met 11/20/22   2.  Patient reports her pain decreased >/= 25%.  Baseline:  Goal status: INITIAL     LONG TERM GOALS: Target date: 12/20/22   Patient independent with advanced HEP for pelvic floor, core and hip strength to improve stability.  Baseline:  Goal status: INITIAL   2.  Patient has full ROM of left hip and lumbar to reduce trigger points in the pelvic floor muscles.  Baseline:  Goal status: INITIAL   3.  Patient bilateral hip strength >/= 4+/5 to stabilize the left SI joint.  Baseline:  Goal status: INITIAL   4.  Patient is able to lift items with correct body mechanics with left hip pain decreased >/= 80%.  Baseline:  Goal status: INITIAL   5.  Patient is able to walk for 45 minutes with left hip pain decreased >/= 80% Baseline:  Goal status: INITIAL     PLAN:   PT FREQUENCY: 1x/week   PT DURATION: 12 weeks   PLANNED INTERVENTIONS: Therapeutic exercises, Therapeutic activity, Neuromuscular re-education, Patient/Family education, Joint mobilization, Dry Needling, Electrical stimulation, Spinal mobilization, Cryotherapy, Moist heat, and Manual therapy   PLAN FOR NEXT SESSION: lower abdominal strength, go over lifting, increase hip strength  Elnita Maxwell  Wallace Cullens, PT 12/04/22 4:59 PM  PHYSICAL THERAPY DISCHARGE SUMMARY  Visits from Start of Care: 3  Current functional level related to goals / functional outcomes: See above. Therapist spoke to patient on 02/02/23 due to a  missed appointment. Patient is busy at this time so she would like to be discharged and resume in the future when her life is calmer.    Remaining deficits: See above.    Education / Equipment: HEP   Patient agrees to discharge. Patient goals were not met. Patient is being discharged due to the patient's request. Thank you for the referral. Eulis Foster, PT 02/02/23 9:14 AM

## 2022-12-11 ENCOUNTER — Ambulatory Visit: Payer: BC Managed Care – PPO | Admitting: Physical Therapy

## 2022-12-20 ENCOUNTER — Ambulatory Visit: Payer: BC Managed Care – PPO | Admitting: Physical Therapy

## 2023-02-02 ENCOUNTER — Ambulatory Visit: Payer: BC Managed Care – PPO | Attending: Sports Medicine | Admitting: Physical Therapy

## 2023-02-02 DIAGNOSIS — M6281 Muscle weakness (generalized): Secondary | ICD-10-CM | POA: Insufficient documentation

## 2023-02-02 DIAGNOSIS — M25552 Pain in left hip: Secondary | ICD-10-CM | POA: Insufficient documentation

## 2023-02-02 DIAGNOSIS — M62838 Other muscle spasm: Secondary | ICD-10-CM | POA: Insufficient documentation

## 2023-05-29 ENCOUNTER — Ambulatory Visit: Payer: BC Managed Care – PPO | Admitting: Family Medicine

## 2023-06-04 ENCOUNTER — Ambulatory Visit: Payer: BC Managed Care – PPO | Admitting: Family Medicine

## 2023-07-17 ENCOUNTER — Encounter: Payer: Self-pay | Admitting: Family Medicine

## 2023-07-17 ENCOUNTER — Ambulatory Visit: Payer: BC Managed Care – PPO | Admitting: Family Medicine

## 2023-07-17 VITALS — BP 116/78 | HR 86 | Temp 98.0°F | Resp 16 | Ht 64.0 in | Wt 120.0 lb

## 2023-07-17 DIAGNOSIS — G4700 Insomnia, unspecified: Secondary | ICD-10-CM | POA: Diagnosis not present

## 2023-07-17 DIAGNOSIS — D1721 Benign lipomatous neoplasm of skin and subcutaneous tissue of right arm: Secondary | ICD-10-CM | POA: Diagnosis not present

## 2023-07-17 MED ORDER — TRAZODONE HCL 50 MG PO TABS: 25.0000 mg | ORAL_TABLET | Freq: Every evening | ORAL | 3 refills | Status: DC | PRN

## 2023-07-17 NOTE — Progress Notes (Signed)
Chief Complaint  Patient presents with   Establish Care    Establishing Care    Hailey Schneider is a 32 y.o. female here for a skin complaint.  Duration: 10 yrs Location: R shoulder Pruritic? No Painful? No Drainage? No Other associated symptoms: slightly bigger Therapies tried thus far: none  Insomnia Over the past several years the patient has had worsening sleep several times per week.  She will sometimes have difficulty falling asleep or wake up in the middle the night taking 1 to 2 hours to fall back asleep.  She does not feel well rested.  She does have young children.  About 1 month ago, her youngest child started to sleep for the night.  No improvement since then.  Sometimes will have racing thoughts.  She is not following with a therapist.  She tries avoid looking at her cell phone prior to bed.  Past Medical History:  Diagnosis Date   Frequent headaches    History of postpartum hemorrhage    PONV (postoperative nausea and vomiting)     BP 116/78   Pulse 86   Temp 98 F (36.7 C) (Oral)   Resp 16   Ht 5\' 4"  (1.626 m)   Wt 120 lb (54.4 kg)   SpO2 100%   BMI 20.60 kg/m  Gen: awake, alert, appearing stated age Lungs: No accessory muscle use Skin: On the right lateral deltoid, there is a well-circumscribed elliptically shaped lesion measuring 3 x 2 cm in diameter.  It is rubbery in nature and freely movable.  There is no overlying skin color changes.  No TTP, fluctuance, or drainage.. 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  She is breast-feeding.  Start trazodone 25 to 50 mg nightly as needed.  Told her that peak concentration of trazodone is after 2 hours of taking it which she does not breast-feed at night anymore.  Reviewed amount of medication present breastmilk which is acceptable.  She will take on an as-needed  basis. CBT contact info provided.  Reassurance.  Offered referral to dermatology if she ever changes her mind. F/u prn. The patient voiced understanding and agreement to the plan.  Jilda Roche [place], DO 07/17/23 9:55 AM

## 2023-07-17 NOTE — Patient Instructions (Addendum)
Sleep Hygiene Tips: Do not watch TV or look at screens within 1 hour of going to bed. If you do, make sure there is a blue light filter (nighttime mode) involved. Try to go to bed around the same time every night. Wake up at the same time within 1 hour of regular time. Ex: If you wake up at 7 AM for work, do not sleep past 8 AM on days that you don't work. Do not drink alcohol before bedtime. Do not consume caffeine-containing beverages after noon or within 9 hours of intended bedtime. Get regular exercise/physical activity in your life, but not within 2 hours of planned bedtime. Do not take naps.  Do not eat within 2 hours of planned bedtime. Melatonin, 3-5 mg 30-60 minutes before planned bedtime may be helpful.  The bed should be for sleep or sex only. If after 20-30 minutes you are unable to fall asleep, get up and do something relaxing. Do this until you feel ready to go to sleep again.   Sleep is important to [place] all. Getting good sleep is imperative to adequate functioning during the day. Work with our counselors who are trained to help people obtain quality sleep. Call [phone] to schedule an appointment or if you are curious about insurance coverage/cost.  Aim to do some physical exertion for 150 minutes per week. This is typically divided into 5 days per week, 30 minutes per day. The activity should be enough to get your heart rate up. Anything is better than nothing if you have time constraints.  Send me a message if you would like to see a dermatologist, send me a message.   Let us know if you need anything.

## 2024-01-15 ENCOUNTER — Encounter: Payer: Self-pay | Admitting: Family Medicine

## 2024-01-15 ENCOUNTER — Ambulatory Visit: Payer: Self-pay | Admitting: Family Medicine

## 2024-01-15 ENCOUNTER — Ambulatory Visit (INDEPENDENT_AMBULATORY_CARE_PROVIDER_SITE_OTHER): Payer: BC Managed Care – PPO | Admitting: Family Medicine

## 2024-01-15 VITALS — BP 112/74 | HR 82 | Temp 97.8°F | Resp 16 | Ht 64.0 in | Wt 127.8 lb

## 2024-01-15 DIAGNOSIS — Z0000 Encounter for general adult medical examination without abnormal findings: Secondary | ICD-10-CM

## 2024-01-15 LAB — COMPREHENSIVE METABOLIC PANEL WITH GFR
ALT: 15 U/L (ref 0–35)
AST: 17 U/L (ref 0–37)
Albumin: 4.5 g/dL (ref 3.5–5.2)
Alkaline Phosphatase: 48 U/L (ref 39–117)
BUN: 10 mg/dL (ref 6–23)
CO2: 28 meq/L (ref 19–32)
Calcium: 9.2 mg/dL (ref 8.4–10.5)
Chloride: 103 meq/L (ref 96–112)
Creatinine, Ser: 0.61 mg/dL (ref 0.40–1.20)
GFR: 119.71 mL/min (ref >60.00)
Glucose, Bld: 82 mg/dL (ref 70–99)
Potassium: 4 meq/L (ref 3.5–5.1)
Sodium: 136 meq/L (ref 135–145)
Total Bilirubin: 0.4 mg/dL (ref 0.2–1.2)
Total Protein: 7.3 g/dL (ref 6.0–8.3)

## 2024-01-15 LAB — LIPID PANEL
Cholesterol: 171 mg/dL (ref 0–200)
HDL: 74.4 mg/dL (ref >39.00)
LDL Cholesterol: 90 mg/dL (ref 0–99)
NonHDL: 96.61
Total CHOL/HDL Ratio: 2
Triglycerides: 33 mg/dL (ref 0.0–149.0)
VLDL: 6.6 mg/dL (ref 0.0–40.0)

## 2024-01-15 LAB — CBC
HCT: 36.9 % (ref 36.0–46.0)
Hemoglobin: 11.7 g/dL — ABNORMAL LOW (ref 12.0–15.0)
MCHC: 31.6 g/dL (ref 30.0–36.0)
MCV: 72.2 fl — ABNORMAL LOW (ref 78.0–100.0)
Platelets: 347 10*3/uL (ref 150.0–400.0)
RBC: 5.11 Mil/uL (ref 3.87–5.11)
RDW: 20.5 % — ABNORMAL HIGH (ref 11.5–15.5)
WBC: 5.7 10*3/uL (ref 4.0–10.5)

## 2024-01-15 NOTE — Progress Notes (Signed)
 Chief Complaint  Patient presents with   Annual Exam    CPE     Well Woman Hailey Schneider is here for a complete physical.   Her last physical was >1 year ago.  Current diet: in general, a healthy diet. Current exercise: none. Contraception? Yes Fatigue out of ordinary? No Seatbelt? Yes Advanced directive? No  Health Maintenance Pap/HPV- Yes Tetanus- Yes HIV screening- Yes Hep C screening- Yes  Past Medical History:  Diagnosis Date   Frequent headaches    History of postpartum hemorrhage    PONV (postoperative nausea and vomiting)      Past Surgical History:  Procedure Laterality Date   WISDOM TOOTH EXTRACTION      Medications  Current Outpatient Medications on File Prior to Visit  Medication Sig Dispense Refill   ferrous sulfate  325 (65 FE) MG tablet Take 325 mg by mouth daily with breakfast.     Prenatal MV-Min-Fe Fum-FA-DHA (PRENATAL 1 PO) Take 1 tablet by mouth daily.      Allergies No Known Allergies  Review of Systems: Constitutional:  no unexpected weight changes Eye:  no recent significant change in vision Ear/Nose/Mouth/Throat:  Ears:  no tinnitus or vertigo and no recent change in hearing Nose/Mouth/Throat:  no complaints of nasal congestion, no sore throat Cardiovascular: no chest pain Respiratory:  no cough and no shortness of breath Gastrointestinal:  no abdominal pain, no change in bowel habits GU:  Female: negative for dysuria or pelvic pain Musculoskeletal/Extremities:  no pain of the joints Integumentary (Skin/Breast):  no abnormal skin lesions reported Neurologic:  no headaches Endocrine:  denies fatigue Hematologic/Lymphatic:  No areas of easy bleeding  Exam BP 112/74 (BP Location: Left Arm, Patient Position: Sitting)   Pulse 82   Temp 97.8 F (36.6 C) (Oral)   Resp 16   Ht 5' 4 (1.626 m)   Wt 127 lb 12.8 oz (58 kg)   SpO2 99%   BMI 21.94 kg/m  General:  well developed, well nourished, in no apparent distress Skin:  no  significant moles, warts, or growths Head:  no masses, lesions, or tenderness Eyes:  pupils equal and round, sclera anicteric without injection Ears:  canals without lesions, TMs shiny without retraction, no obvious effusion, no erythema Nose:  nares patent, mucosa normal, and no drainage  Throat/Pharynx:  lips and gingiva without lesion; tongue and uvula midline; non-inflamed pharynx; no exudates or postnasal drainage Neck: neck supple without adenopathy, thyromegaly, or masses Lungs:  clear to auscultation, breath sounds equal bilaterally, no respiratory distress Cardio:  regular rate and rhythm, no bruits, no LE edema Abdomen:  abdomen soft, nontender; bowel sounds normal; no masses or organomegaly Genital: Defer to GYN Musculoskeletal:  symmetrical muscle groups noted without atrophy or deformity Extremities:  no clubbing, cyanosis, or edema, no deformities, no skin discoloration Neuro:  gait normal; deep tendon reflexes normal and symmetric Psych: well oriented with normal range of affect and appropriate judgment/insight  Assessment and Plan  Well adult exam - Plan: CBC, Comprehensive metabolic panel with GFR, Lipid panel   Well 33 y.o. female. Counseled on diet and exercise. Advanced directive form provided today.  Other orders as above. Follow up in 1 yr. The patient voiced understanding and agreement to the plan.  Hailey [place], DO 01/15/24 8:58 AM

## 2024-01-15 NOTE — Patient Instructions (Addendum)
Give us 2-3 business days to get the results of your labs back.  ? ?Keep the diet clean and stay active. ? ?Please get me a copy of your advanced directive form at your convenience.  ? ?OK to use Debrox (peroxide) in the ear to loosen up wax. Also recommend using a bulb syringe (for removing boogers from baby's noses) to flush through warm water and vinegar (3-4:1 ratio). An alternative, though more expensive, is an elephant ear washer wax removal kit. Do not use Q-tips as this can impact wax further. ? ?Let us know if you need anything. ?

## 2024-05-13 ENCOUNTER — Ambulatory Visit: Admitting: Family Medicine

## 2024-05-13 ENCOUNTER — Ambulatory Visit: Payer: Self-pay | Admitting: Family Medicine

## 2024-05-13 ENCOUNTER — Encounter: Payer: Self-pay | Admitting: Family Medicine

## 2024-05-13 VITALS — BP 110/70 | HR 90 | Temp 98.0°F | Resp 16 | Ht 64.0 in | Wt 133.4 lb

## 2024-05-13 DIAGNOSIS — R635 Abnormal weight gain: Secondary | ICD-10-CM | POA: Diagnosis not present

## 2024-05-13 DIAGNOSIS — H9201 Otalgia, right ear: Secondary | ICD-10-CM

## 2024-05-13 DIAGNOSIS — N926 Irregular menstruation, unspecified: Secondary | ICD-10-CM | POA: Diagnosis not present

## 2024-05-13 DIAGNOSIS — R5383 Other fatigue: Secondary | ICD-10-CM

## 2024-05-13 LAB — COMPREHENSIVE METABOLIC PANEL WITH GFR
ALT: 12 U/L (ref 0–35)
AST: 13 U/L (ref 0–37)
Albumin: 4.5 g/dL (ref 3.5–5.2)
Alkaline Phosphatase: 43 U/L (ref 39–117)
BUN: 10 mg/dL (ref 6–23)
CO2: 27 meq/L (ref 19–32)
Calcium: 9 mg/dL (ref 8.4–10.5)
Chloride: 103 meq/L (ref 96–112)
Creatinine, Ser: 0.58 mg/dL (ref 0.40–1.20)
GFR: 120.9 mL/min (ref >60.00)
Glucose, Bld: 86 mg/dL (ref 70–99)
Potassium: 4.1 meq/L (ref 3.5–5.1)
Sodium: 137 meq/L (ref 135–145)
Total Bilirubin: 0.5 mg/dL (ref 0.2–1.2)
Total Protein: 7.3 g/dL (ref 6.0–8.3)

## 2024-05-13 LAB — LUTEINIZING HORMONE: LH: 8.6 m[IU]/mL

## 2024-05-13 LAB — VITAMIN B12: Vitamin B-12: 597 pg/mL (ref 211–911)

## 2024-05-13 LAB — CBC
HCT: 37.3 % (ref 36.0–46.0)
Hemoglobin: 12 g/dL (ref 12.0–15.0)
MCHC: 32.3 g/dL (ref 30.0–36.0)
MCV: 78 fl (ref 78.0–100.0)
Platelets: 349 K/uL (ref 150.0–400.0)
RBC: 4.78 Mil/uL (ref 3.87–5.11)
RDW: 15.3 % (ref 11.5–15.5)
WBC: 4 K/uL (ref 4.0–10.5)

## 2024-05-13 LAB — VITAMIN D 25 HYDROXY (VIT D DEFICIENCY, FRACTURES): VITD: 18.35 ng/mL — ABNORMAL LOW (ref 30.00–100.00)

## 2024-05-13 LAB — FOLLICLE STIMULATING HORMONE: FSH: 7.1 m[IU]/mL

## 2024-05-13 LAB — TSH: TSH: 1.79 u[IU]/mL (ref 0.35–5.50)

## 2024-05-13 LAB — HCG, QUANTITATIVE, PREGNANCY: Quantitative HCG: <0.6 m[IU]/mL

## 2024-05-13 NOTE — Patient Instructions (Addendum)
 Give us  2-3 business days to get the results of your labs back.   Keep the diet clean and stay active.  Aim to do some physical exertion for 150 minutes per week. This is typically divided into 5 days per week, 30 minutes per day. The activity should be enough to get your heart rate up. Anything is better than nothing if you have time constraints.  Try to drink 55-60 oz of water daily outside of exercise.  Heat (pad or rice pillow in microwave) over affected area, 10-15 minutes twice daily.   Ice/cold pack over area for 10-15 min twice daily.  OK to take Tylenol  1000 mg (2 extra strength tabs) or 975 mg (3 regular strength tabs) every 6 hours as needed.  Let us  know if you need anything.  EXERCISES RANGE OF MOTION (ROM) AND STRETCHING EXERCISES  These exercises may help you when beginning to rehabilitate your issue. In order to successfully resolve your symptoms, you must improve your posture. These exercises are designed to help reduce the forward-head and rounded-shoulder posture which contributes to this condition. Your symptoms may resolve with or without further involvement from your physician, physical therapist or athletic trainer. While completing these exercises, remember:  Restoring tissue flexibility helps normal motion to return to the joints. This allows healthier, less painful movement and activity. An effective stretch should be held for at least 20 seconds, although you may need to begin with shorter hold times for comfort. A stretch should never be painful. You should only feel a gentle lengthening or release in the stretched tissue. Do not do any stretch or exercise that you cannot tolerate.  STRETCH- Axial Extensors Lie on your back on the floor. You may bend your knees for comfort. Place a rolled-up hand towel or dish towel, about 2 inches in diameter, under the part of your head that makes contact with the floor. Gently tuck your chin, as if trying to make a double  chin, until you feel a gentle stretch at the base of your head. Hold 15-20 seconds. Repeat 2-3 times. Complete this exercise 1 time per day.   STRETCH - Axial Extension  Stand or sit on a firm surface. Assume a good posture: chest up, shoulders drawn back, abdominal muscles slightly tense, knees unlocked (if standing) and feet hip width apart. Slowly retract your chin so your head slides back and your chin slightly lowers. Continue to look straight ahead. You should feel a gentle stretch in the back of your head. Be certain not to feel an aggressive stretch since this can cause headaches later. Hold for 15-20 seconds. Repeat 2-3 times. Complete this exercise 1 time per day.  STRETCH - Cervical Side Bend  Stand or sit on a firm surface. Assume a good posture: chest up, shoulders drawn back, abdominal muscles slightly tense, knees unlocked (if standing) and feet hip width apart. Without letting your nose or shoulders move, slowly tip your right / left ear to your shoulder until your feel a gentle stretch in the muscles on the opposite side of your neck. Hold 15-20 seconds. Repeat 2-3 times. Complete this exercise 1-2 times per day.  STRETCH - Cervical Rotators  Stand or sit on a firm surface. Assume a good posture: chest up, shoulders drawn back, abdominal muscles slightly tense, knees unlocked (if standing) and feet hip width apart. Keeping your eyes level with the ground, slowly turn your head until you feel a gentle stretch along the back and opposite side of your  neck. Hold 15-20 seconds. Repeat 2-3 times. Complete this exercise 1-2 times per day.  RANGE OF MOTION - Neck Circles  Stand or sit on a firm surface. Assume a good posture: chest up, shoulders drawn back, abdominal muscles slightly tense, knees unlocked (if standing) and feet hip width apart. Gently roll your head down and around from the back of one shoulder to the back of the other. The motion should never be forced or  painful. Repeat the motion 10-20 times, or until you feel the neck muscles relax and loosen. Repeat 2-3 times. Complete the exercise 1-2 times per day. STRENGTHENING EXERCISES - Cervical Strain and Sprain These exercises may help you when beginning to rehabilitate your injury. They may resolve your symptoms with or without further involvement from your physician, physical therapist, or athletic trainer. While completing these exercises, remember:  Muscles can gain both the endurance and the strength needed for everyday activities through controlled exercises. Complete these exercises as instructed by your physician, physical therapist, or athletic trainer. Progress the resistance and repetitions only as guided. You may experience muscle soreness or fatigue, but the pain or discomfort you are trying to eliminate should never worsen during these exercises. If this pain does worsen, stop and make certain you are following the directions exactly. If the pain is still present after adjustments, discontinue the exercise until you can discuss the trouble with your clinician.  STRENGTH - Cervical Flexors, Isometric Face a wall, standing about 6 inches away. Place a small pillow, a ball about 6-8 inches in diameter, or a folded towel between your forehead and the wall. Slightly tuck your chin and gently push your forehead into the soft object. Push only with mild to moderate intensity, building up tension gradually. Keep your jaw and forehead relaxed. Hold 10 to 20 seconds. Keep your breathing relaxed. Release the tension slowly. Relax your neck muscles completely before you start the next repetition. Repeat 2-3 times. Complete this exercise 1 time per day.  STRENGTH- Cervical Lateral Flexors, Isometric  Stand about 6 inches away from a wall. Place a small pillow, a ball about 6-8 inches in diameter, or a folded towel between the side of your head and the wall. Slightly tuck your chin and gently tilt your  head into the soft object. Push only with mild to moderate intensity, building up tension gradually. Keep your jaw and forehead relaxed. Hold 10 to 20 seconds. Keep your breathing relaxed. Release the tension slowly. Relax your neck muscles completely before you start the next repetition. Repeat 2-3 times. Complete this exercise 1 time per day.  STRENGTH - Cervical Extensors, Isometric  Stand about 6 inches away from a wall. Place a small pillow, a ball about 6-8 inches in diameter, or a folded towel between the back of your head and the wall. Slightly tuck your chin and gently tilt your head back into the soft object. Push only with mild to moderate intensity, building up tension gradually. Keep your jaw and forehead relaxed. Hold 10 to 20 seconds. Keep your breathing relaxed. Release the tension slowly. Relax your neck muscles completely before you start the next repetition. Repeat 2-3 times. Complete this exercise 1 time per day.  POSTURE AND BODY MECHANICS CONSIDERATIONS Keeping correct posture when sitting, standing or completing your activities will reduce the stress put on different body tissues, allowing injured tissues a chance to heal and limiting painful experiences. The following are general guidelines for improved posture. Your physician or physical therapist will provide you  with any instructions specific to your needs. While reading these guidelines, remember: The exercises prescribed by your provider will help you have the flexibility and strength to maintain correct postures. The correct posture provides the optimal environment for your joints to work. All of your joints have less wear and tear when properly supported by a spine with good posture. This means you will experience a healthier, less painful body. Correct posture must be practiced with all of your activities, especially prolonged sitting and standing. Correct posture is as important when doing repetitive low-stress  activities (typing) as it is when doing a single heavy-load activity (lifting).  PROLONGED STANDING WHILE SLIGHTLY LEANING FORWARD When completing a task that requires you to lean forward while standing in one place for a long time, place either foot up on a stationary 2- to 4-inch high object to help maintain the best posture. When both feet are on the ground, the low back tends to lose its slight inward curve. If this curve flattens (or becomes too large), then the back and your other joints will experience too much stress, fatigue more quickly, and can cause pain.   RESTING POSITIONS Consider which positions are most painful for you when choosing a resting position. If you have pain with flexion-based activities (sitting, bending, stooping, squatting), choose a position that allows you to rest in a less flexed posture. You would want to avoid curling into a fetal position on your side. If your pain worsens with extension-based activities (prolonged standing, working overhead), avoid resting in an extended position such as sleeping on your stomach. Most people will find more comfort when they rest with their spine in a more neutral position, neither too rounded nor too arched. Lying on a non-sagging bed on your side with a pillow between your knees, or on your back with a pillow under your knees will often provide some relief. Keep in mind, being in any one position for a prolonged period of time, no matter how correct your posture, can still lead to stiffness.  WALKING Walk with an upright posture. Your ears, shoulders, and hips should all line up. OFFICE WORK When working at a desk, create an environment that supports good, upright posture. Without extra support, muscles fatigue and lead to excessive strain on joints and other tissues.  CHAIR: A chair should be able to slide under your desk when your back makes contact with the back of the chair. This allows you to work closely. The chair's height  should allow your eyes to be level with the upper part of your monitor and your hands to be slightly lower than your elbows. Body position: Your feet should make contact with the floor. If this is not possible, use a foot rest. Keep your ears over your shoulders. This will reduce stress on your neck and low back.

## 2024-05-13 NOTE — Progress Notes (Signed)
 Chief Complaint  Patient presents with   Ear Pain    Ear Pain and Headaches     Pt is here for right ear pain. Duration: 5 days Progression: better Associated symptoms: congestion Denies: sore throat, coryza, and sneezing, bleeding, or discharge from ear Treatment to date: none  Over the past 3 months, the patient has had more fatigue.  She has been sleeping well and getting around 7 to 9 hours of sleep routinely.  She does snore at night but no one ever says she stops breathing.  She does not wake up gasping for air but will wake up feeling exhausted.  Diet is fair, she does not exercise routinely.  Her mood is stable.  No diarrhea, nausea, vomiting, or bleeding.  She is trying to get pregnant and is a little bit late with her cycle.  She does have associated weight gain of around 5 pounds.  Past Medical History:  Diagnosis Date   Frequent headaches    History of postpartum hemorrhage    PONV (postoperative nausea and vomiting)     BP 110/70 (BP Location: Left Arm, Patient Position: Sitting)   Pulse 90   Temp 98 F (36.7 C) (Oral)   Resp 16   Ht 5' 4 (1.626 m)   Wt 133 lb 6.4 oz (60.5 kg)   SpO2 99%   BMI 22.90 kg/m  General: Awake, alert, appearing stated age HEENT:  L ear- Canal patent without drainage or erythema, TM is negative R ear- canal 100 percent obstructed with cerumen Nose- nares patent and without discharge Mouth- Lips, gums and dentition unremarkable, pharynx is without erythema or exudate Neck: No adenopathy Neuro: DTRs equal and symmetric throughout, no clonus, no cerebellar signs MSK: Hypertonicity and mild TTP over the suboccipital triangle, cervical paraspinal musculature, and trapezius musculature bilaterally; no TTP over the TMJ or temporalis region Lungs: Normal effort, no accessory muscle use Psych: Age appropriate judgment and insight, normal mood and affect  Missed period - Plan: FSH, LH, hCG, quantitative, pregnancy, CANCELED: hCG, quantitative,  pregnancy  Fatigue, unspecified type - Plan: CBC, Comprehensive metabolic panel with GFR, VITAMIN D 25 Hydroxy (Vit-D Deficiency, Fractures), TSH, B12  Weight gain - Plan: TSH  Right ear pain  Check above.  Doubt she is in premature ovarian failure. Could be multifactorial.  Needs to stay hydrated.  Check above labs.  Counseled on exercise.  Could consider a sleep study if labs are unremarkable. As above. With this getting better, I opted to recommend foregoing cerumen removal as it could be particularly painful.  It is likely a resolving bout of eustachian tube dysfunction.  Possibly a viral otitis media, but given the improvement, there is nothing further that would be done.  She is okay with this, let me know if things worsen. F/u prn.  Pt voiced understanding and agreement to the plan.  I spent 31 minutes with the patient discussing the above plans in addition to reviewing her chart on the same day of the visit.  Mabel Mt Kurten, DO 05/13/24 1:18 PM

## 2024-05-14 ENCOUNTER — Other Ambulatory Visit: Payer: Self-pay

## 2024-05-14 MED ORDER — VITAMIN D (ERGOCALCIFEROL) 1.25 MG (50000 UNIT) PO CAPS: 50000.0000 [IU] | ORAL_CAPSULE | ORAL | 5 refills | Status: DC

## 2024-07-29 ENCOUNTER — Telehealth: Payer: Self-pay | Admitting: Family Medicine

## 2024-07-29 DIAGNOSIS — R5383 Other fatigue: Secondary | ICD-10-CM

## 2024-07-29 NOTE — Telephone Encounter (Signed)
 Patient scheduled but needs lab orders

## 2024-08-04 ENCOUNTER — Encounter: Payer: Self-pay | Admitting: Family Medicine

## 2024-08-11 ENCOUNTER — Other Ambulatory Visit: Payer: Self-pay

## 2024-08-11 ENCOUNTER — Other Ambulatory Visit (INDEPENDENT_AMBULATORY_CARE_PROVIDER_SITE_OTHER)

## 2024-08-11 ENCOUNTER — Ambulatory Visit: Payer: Self-pay | Admitting: Family Medicine

## 2024-08-11 DIAGNOSIS — R5383 Other fatigue: Secondary | ICD-10-CM

## 2024-08-11 DIAGNOSIS — E559 Vitamin D deficiency, unspecified: Secondary | ICD-10-CM

## 2024-08-11 LAB — VITAMIN D 25 HYDROXY (VIT D DEFICIENCY, FRACTURES): VITD: 28.23 ng/mL — ABNORMAL LOW (ref 30.00–100.00)

## 2024-08-11 MED ORDER — VITAMIN D (ERGOCALCIFEROL) 1.25 MG (50000 UNIT) PO CAPS: 50000.0000 [IU] | ORAL_CAPSULE | ORAL | 1 refills | Status: AC

## 2025-01-16 ENCOUNTER — Encounter: Admitting: Family Medicine
